# Patient Record
Sex: Female | Born: 2000 | Race: White | Hispanic: No | Marital: Single | Smoking: Former smoker
Health system: Southern US, Community
[De-identification: ages and names within clinical notes are randomized; demographics above are authoritative.]

## PROBLEM LIST (undated history)

## (undated) DIAGNOSIS — F32A Depression, unspecified: Secondary | ICD-10-CM

## (undated) DIAGNOSIS — F329 Major depressive disorder, single episode, unspecified: Secondary | ICD-10-CM

## (undated) HISTORY — PX: TONSILLECTOMY AND ADENOIDECTOMY: SUR1326

---
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
## 2018-03-11 DIAGNOSIS — M2550 Pain in unspecified joint: Secondary | ICD-10-CM | POA: Insufficient documentation

## 2018-03-11 DIAGNOSIS — R768 Other specified abnormal immunological findings in serum: Secondary | ICD-10-CM | POA: Insufficient documentation

## 2019-02-08 ENCOUNTER — Telehealth: Payer: Self-pay | Admitting: Obstetrics & Gynecology

## 2019-02-08 NOTE — Telephone Encounter (Signed)
[facility] referring for inability to achieve pregnancy. I called and spoke with patient about infertility consultation advising insurance doesn't cover appointment that it would be a self pay of 117.00 at the time of visit. Patient requested to cancel referral.

## 2019-04-04 ENCOUNTER — Other Ambulatory Visit: Payer: Self-pay | Admitting: Nurse Practitioner

## 2019-04-04 DIAGNOSIS — R102 Pelvic and perineal pain: Secondary | ICD-10-CM

## 2019-04-04 DIAGNOSIS — N949 Unspecified condition associated with female genital organs and menstrual cycle: Secondary | ICD-10-CM

## 2019-04-27 ENCOUNTER — Other Ambulatory Visit: Payer: Self-pay

## 2019-04-27 ENCOUNTER — Ambulatory Visit
Admission: RE | Admit: 2019-04-27 | Discharge: 2019-04-27 | Disposition: A | Discharge status: Home / Self Care | Payer: Medicaid Other | Source: Ambulatory Visit | Attending: Nurse Practitioner | Admitting: Nurse Practitioner

## 2019-04-27 DIAGNOSIS — N949 Unspecified condition associated with female genital organs and menstrual cycle: Secondary | ICD-10-CM | POA: Diagnosis present

## 2019-04-27 DIAGNOSIS — R102 Pelvic and perineal pain: Secondary | ICD-10-CM | POA: Diagnosis not present

## 2019-05-04 ENCOUNTER — Other Ambulatory Visit: Payer: Self-pay

## 2019-05-17 ENCOUNTER — Ambulatory Visit (INDEPENDENT_AMBULATORY_CARE_PROVIDER_SITE_OTHER): Payer: Medicaid Other | Admitting: Obstetrics and Gynecology

## 2019-05-17 ENCOUNTER — Other Ambulatory Visit: Payer: Self-pay

## 2019-05-17 ENCOUNTER — Encounter: Payer: Self-pay | Admitting: Obstetrics and Gynecology

## 2019-05-17 VITALS — BP 99/64 | HR 74 | Ht 64.0 in | Wt 121.0 lb

## 2019-05-17 DIAGNOSIS — Q524 Other congenital malformations of vagina: Secondary | ICD-10-CM

## 2019-05-17 NOTE — Progress Notes (Signed)
HPI:      Susan Price is a 19 y.o. No obstetric history on file. who LMP was Patient's last menstrual period was 04/29/2019.  Subjective:   She presents today because she recently noticed a small cyst located in her right labia.  She states that she thinks it has gotten a little bit bigger and is slightly more tender than when she first noticed it.  She mainly notices it with intercourse.  It is not generally painful on a day-to-day basis. She recently had an ultrasound which showed a 2 cm cyst.    Hx: The following portions of the patient's history were reviewed and updated as appropriate:             She  has a past medical history of Depression. She does not have a problem list on file. She  has a past surgical history that includes Tonsillectomy and adenoidectomy. Her family history includes Anxiety disorder in her mother. She  reports that she has been smoking cigarettes. She has never used smokeless tobacco. She reports that she does not drink alcohol or use drugs. She currently has no medications in their medication list. She has No Known Allergies.       Review of Systems:  Review of Systems  Constitutional: Denied constitutional symptoms, night sweats, recent illness, fatigue, fever, insomnia and weight loss.  Eyes: Denied eye symptoms, eye pain, photophobia, vision change and visual disturbance.  Ears/Nose/Throat/Neck: Denied ear, nose, throat or neck symptoms, hearing loss, nasal discharge, sinus congestion and sore throat.  Cardiovascular: Denied cardiovascular symptoms, arrhythmia, chest pain/pressure, edema, exercise intolerance, orthopnea and palpitations.  Respiratory: Denied pulmonary symptoms, asthma, pleuritic pain, productive sputum, cough, dyspnea and wheezing.  Gastrointestinal: Denied, gastro-esophageal reflux, melena, nausea and vomiting.  Genitourinary: See HPI for additional information.  Musculoskeletal: Denied musculoskeletal symptoms, stiffness,  swelling, muscle weakness and myalgia.  Dermatologic: Denied dermatology symptoms, rash and scar.  Neurologic: Denied neurology symptoms, dizziness, headache, neck pain and syncope.  Psychiatric: Denied psychiatric symptoms, anxiety and depression.  Endocrine: Denied endocrine symptoms including hot flashes and night sweats.   Meds:   No current outpatient medications on file prior to visit.   No current facility-administered medications on file prior to visit.    Objective:     Vitals:   05/17/19 1535  BP: 99/64  Pulse: 74              Examination reveals a cyst deep within the right labia majus.  Possibly 2 cm in diameter.  Nontender.  Mobile.  By exclusion this is not located in the area of sebaceous cysts or Bartholin gland cyst.  Assessment:    No obstetric history on file. There are no problems to display for this patient.    1. Embryonic cyst of right Gartner's duct     Most likely a Gartner's duct remnant cyst.  Not a lymph node, not Bartholin cyst.   Plan:            1.  I have explained the options to her.  These include expectant management, or excision.  I have strongly advised that she employ expectant management until either it enlarges or it becomes significantly uncomfortable.  At that time we can readdress the situation and consider excision.  Orders No orders of the defined types were placed in this encounter.   No orders of the defined types were placed in this encounter.     F/U  Return for Pt to contact us  if symptoms worsen. I spent 31 minutes involved in the care of this patient preparing to see the patient by obtaining and reviewing her medical history (including labs, imaging tests and prior procedures), documenting clinical information in the electronic health record (EHR), counseling and coordinating care plans, writing and sending prescriptions, ordering tests or procedures and directly communicating with the patient by discussing pertinent  items from her history and physical exam as well as detailing my assessment and plan as noted above so that she has an informed understanding.  All of her questions were answered.  Elonda Husky, M.D. 05/17/2019 4:05 PM

## 2019-08-23 ENCOUNTER — Encounter: Payer: Self-pay | Admitting: Obstetrics and Gynecology

## 2019-08-23 ENCOUNTER — Other Ambulatory Visit: Payer: Self-pay

## 2019-08-23 ENCOUNTER — Ambulatory Visit (INDEPENDENT_AMBULATORY_CARE_PROVIDER_SITE_OTHER): Payer: Medicaid Other | Admitting: Obstetrics and Gynecology

## 2019-08-23 VITALS — BP 113/75 | HR 75 | Ht 64.0 in | Wt 122.0 lb

## 2019-08-23 DIAGNOSIS — N912 Amenorrhea, unspecified: Secondary | ICD-10-CM | POA: Diagnosis not present

## 2019-08-23 LAB — POCT URINE PREGNANCY: Preg Test, Ur: POSITIVE — AB

## 2019-08-23 NOTE — Progress Notes (Signed)
HPI:      Susan Price is a 19 y.o. No obstetric history on file. who LMP was No LMP recorded.  Subjective:   She presents today for pregnancy confirmation. She was attempting pregnancy and had been attempting for "approximately 2 years". Patient is taking vitamins but not prenatal vitamins. She has occasional nausea without vomiting. Patient states that she was a regular marijuana user but she stopped this and she noted she was pregnant.    Hx: The following portions of the patient's history were reviewed and updated as appropriate:             She  has a past medical history of Depression. She does not have a problem list on file. She  has a past surgical history that includes Tonsillectomy and adenoidectomy. Her family history includes Anxiety disorder in her mother. She  reports that she has quit smoking. Her smoking use included cigarettes. She has never used smokeless tobacco. She reports current drug use. Drug: Marijuana. She reports that she does not drink alcohol. She has a current medication list which includes the following prescription(s): multivitamin-prenatal. She has No Known Allergies.       Review of Systems:  Review of Systems  Constitutional: Denied constitutional symptoms, night sweats, recent illness, fatigue, fever, insomnia and weight loss.  Eyes: Denied eye symptoms, eye pain, photophobia, vision change and visual disturbance.  Ears/Nose/Throat/Neck: Denied ear, nose, throat or neck symptoms, hearing loss, nasal discharge, sinus congestion and sore throat.  Cardiovascular: Denied cardiovascular symptoms, arrhythmia, chest pain/pressure, edema, exercise intolerance, orthopnea and palpitations.  Respiratory: Denied pulmonary symptoms, asthma, pleuritic pain, productive sputum, cough, dyspnea and wheezing.  Gastrointestinal: Denied, gastro-esophageal reflux, melena, nausea and vomiting.  Genitourinary: Denied genitourinary symptoms including symptomatic vaginal  discharge, pelvic relaxation issues, and urinary complaints.  Musculoskeletal: Denied musculoskeletal symptoms, stiffness, swelling, muscle weakness and myalgia.  Dermatologic: Denied dermatology symptoms, rash and scar.  Neurologic: Denied neurology symptoms, dizziness, headache, neck pain and syncope.  Psychiatric: Denied psychiatric symptoms, anxiety and depression.  Endocrine: Denied endocrine symptoms including hot flashes and night sweats.   Meds:   Current Outpatient Medications on File Prior to Visit  Medication Sig Dispense Refill  . Prenatal Vit-Fe Fumarate-FA (MULTIVITAMIN-PRENATAL) 27-0.8 MG TABS tablet Take 1 tablet by mouth daily at 12 noon.     No current facility-administered medications on file prior to visit.    Objective:     Vitals:   08/23/19 0810  BP: 113/75  Pulse: 75              Urinary pregnancy test positive  Assessment:    No obstetric history on file. There are no problems to display for this patient.    1. Amenorrhea     Approximately 4 weeks estimated gestational age based on last menstrual period.   Plan:            1.  Prenatal Plan 1.  The patient was given prenatal literature. 2.  She was begun on prenatal vitamins. 3.  A prenatal lab panel was ordered or drawn. 4.  An ultrasound was ordered to better determine an EDC. 5.  A nurse visit was scheduled. 6.  Genetic testing and testing for other inheritable conditions discussed in detail. She will decide in the future whether to have these labs performed. 7.  A general overview of pregnancy testing, visit schedule, ultrasound schedule, and prenatal care was discussed. 8.  COVID and its risks associated with pregnancy, prevention by limiting  exposure and use of masks, as well as the risks and benefits of vaccination during pregnancy were discussed in detail.  [facility] policy regarding office and hospital visitation and testing was explained. 9.  Benefits of breast-feeding discussed in  detail including both maternal and infant benefits. Ready Set Baby website discussed.   Orders Orders Placed This Encounter  Procedures  . US OB Comp Less 14 Wks  . US OB Transvaginal  . POCT urine pregnancy    No orders of the defined types were placed in this encounter.     F/U  Return in about 4 weeks (around 09/20/2019). I spent 33 minutes involved in the care of this patient preparing to see the patient by obtaining and reviewing her medical history (including labs, imaging tests and prior procedures), documenting clinical information in the electronic health record (EHR), counseling and coordinating care plans, writing and sending prescriptions, ordering tests or procedures and directly communicating with the patient by discussing pertinent items from her history and physical exam as well as detailing my assessment and plan as noted above so that she has an informed understanding.  All of her questions were answered.  Finis Bud, M.D. 08/23/2019 8:49 AM

## 2019-08-28 ENCOUNTER — Encounter: Payer: Self-pay | Admitting: Emergency Medicine

## 2019-08-28 ENCOUNTER — Emergency Department: Payer: Medicaid Other

## 2019-08-28 ENCOUNTER — Other Ambulatory Visit: Payer: Self-pay

## 2019-08-28 DIAGNOSIS — Z3A01 Less than 8 weeks gestation of pregnancy: Secondary | ICD-10-CM | POA: Diagnosis not present

## 2019-08-28 DIAGNOSIS — O209 Hemorrhage in early pregnancy, unspecified: Secondary | ICD-10-CM | POA: Diagnosis not present

## 2019-08-28 DIAGNOSIS — Z5321 Procedure and treatment not carried out due to patient leaving prior to being seen by health care provider: Secondary | ICD-10-CM | POA: Diagnosis not present

## 2019-08-28 LAB — CBC WITH DIFFERENTIAL/PLATELET
Abs Immature Granulocytes: 0.02 10*3/uL (ref 0.00–0.07)
Basophils Absolute: 0 10*3/uL (ref 0.0–0.1)
Basophils Relative: 0 %
Eosinophils Absolute: 0.1 10*3/uL (ref 0.0–0.5)
Eosinophils Relative: 1 %
HCT: 39.8 % (ref 36.0–46.0)
Hemoglobin: 13.9 g/dL (ref 12.0–15.0)
Immature Granulocytes: 0 %
Lymphocytes Relative: 39 %
Lymphs Abs: 2.7 10*3/uL (ref 0.7–4.0)
MCH: 28.5 pg (ref 26.0–34.0)
MCHC: 34.9 g/dL (ref 30.0–36.0)
MCV: 81.7 fL (ref 80.0–100.0)
Monocytes Absolute: 0.6 10*3/uL (ref 0.1–1.0)
Monocytes Relative: 9 %
Neutro Abs: 3.4 10*3/uL (ref 1.7–7.7)
Neutrophils Relative %: 51 %
Platelets: 241 10*3/uL (ref 150–400)
RBC: 4.87 MIL/uL (ref 3.87–5.11)
RDW: 12.8 % (ref 11.5–15.5)
WBC: 6.8 10*3/uL (ref 4.0–10.5)
nRBC: 0 % (ref 0.0–0.2)

## 2019-08-28 LAB — BASIC METABOLIC PANEL
Anion gap: 9 (ref 5–15)
BUN: 10 mg/dL (ref 6–20)
CO2: 22 mmol/L (ref 22–32)
Calcium: 9.4 mg/dL (ref 8.9–10.3)
Chloride: 105 mmol/L (ref 98–111)
Creatinine, Ser: 0.49 mg/dL (ref 0.44–1.00)
GFR calc Af Amer: >60 mL/min (ref >60)
GFR calc non Af Amer: >60 mL/min (ref >60)
Glucose, Bld: 95 mg/dL (ref 70–99)
Potassium: 3.6 mmol/L (ref 3.5–5.1)
Sodium: 136 mmol/L (ref 135–145)

## 2019-08-28 LAB — TYPE AND SCREEN
ABO/RH(D): O NEG
Antibody Screen: NEGATIVE

## 2019-08-28 NOTE — ED Triage Notes (Signed)
Pt arrives ambulatory to triage with c/o vaginal bleeding. Pt reports being approximately [redacted] weeks pregnant at this time. Pt states that she had some spotting this morning but now is having bright red blood at this time but not using more than one pad an hour. Pt is tearful in triage but is otherwise in NAD.

## 2019-08-29 ENCOUNTER — Emergency Department
Admission: EM | Admit: 2019-08-29 | Discharge: 2019-08-29 | Disposition: A | Discharge status: Home / Self Care | Payer: Medicaid Other | Attending: Emergency Medicine | Admitting: Emergency Medicine

## 2019-08-29 ENCOUNTER — Ambulatory Visit (INDEPENDENT_AMBULATORY_CARE_PROVIDER_SITE_OTHER): Payer: Medicaid Other | Admitting: Obstetrics and Gynecology

## 2019-08-29 ENCOUNTER — Encounter: Payer: Self-pay | Admitting: Obstetrics and Gynecology

## 2019-08-29 ENCOUNTER — Telehealth: Payer: Self-pay | Admitting: Emergency Medicine

## 2019-08-29 VITALS — BP 98/62 | Wt 119.0 lb

## 2019-08-29 DIAGNOSIS — O3680X0 Pregnancy with inconclusive fetal viability, not applicable or unspecified: Secondary | ICD-10-CM | POA: Diagnosis not present

## 2019-08-29 DIAGNOSIS — Z3400 Encounter for supervision of normal first pregnancy, unspecified trimester: Secondary | ICD-10-CM | POA: Diagnosis not present

## 2019-08-29 DIAGNOSIS — O209 Hemorrhage in early pregnancy, unspecified: Secondary | ICD-10-CM

## 2019-08-29 DIAGNOSIS — B373 Candidiasis of vulva and vagina: Secondary | ICD-10-CM

## 2019-08-29 DIAGNOSIS — Z3A01 Less than 8 weeks gestation of pregnancy: Secondary | ICD-10-CM | POA: Diagnosis not present

## 2019-08-29 DIAGNOSIS — N760 Acute vaginitis: Secondary | ICD-10-CM

## 2019-08-29 DIAGNOSIS — B3731 Acute candidiasis of vulva and vagina: Secondary | ICD-10-CM

## 2019-08-29 LAB — HCG, QUANTITATIVE, PREGNANCY: hCG, Beta Chain, Quant, S: 19062 m[IU]/mL — ABNORMAL HIGH (ref <5)

## 2019-08-29 MED ORDER — RHO D IMMUNE GLOBULIN 1500 UNIT/2ML IJ SOSY
300.0000 ug | PREFILLED_SYRINGE | Freq: Once | INTRAMUSCULAR | Status: AC
  Administered 2019-08-29: 300 ug via INTRAMUSCULAR

## 2019-08-29 MED ORDER — TERCONAZOLE 0.4 % VA CREA: 1.0000 | TOPICAL_CREAM | Freq: Every day | VAGINAL | 0 refills | Status: DC

## 2019-08-29 NOTE — Telephone Encounter (Signed)
Called patient due to lwot to inquire about condition and follow up plans.   She says she has stopped bleeding at this time.  I explained negative rh and importance of being seen.  She says she has appointment at [facility] today.   I also called [facility] and informed them of RH neg result--they are able to give rhogam there.

## 2019-08-29 NOTE — Progress Notes (Signed)
08/29/2019   Chief Complaint: Missed period  Transfer of Care Patient: yes  History of Present Illness: Susan Price is a 19 y.o. G1P0 [redacted]w[redacted]d based on Patient's last menstrual period was 07/24/2019 (exact date). with an Estimated Date of Delivery: 04/29/20, with the above CC.   Her periods were: regular monthly periods She was using no method when she conceived.  She has Negative signs or symptoms of nausea/vomiting of pregnancy. She has Positive signs or symptoms of miscarriage or preterm labor She was not taking different medications around the time she conceived/early pregnancy. Since her LMP, she has not used alcohol Since her LMP, she has not used tobacco products Since her LMP, she has not used illegal drugs.    Current or past history of domestic violence. no  Infection History:  1. Since her LMP, she has not had a viral illness.  2. She denies close contact with children on a regular basis     3. She denies a history of chicken pox. She denies vaccination for chicken pox in the past. 4. Patient or partner has history of genital herpes  no 5. History of STI (GC, CT, HPV, syphilis, HIV)  no   6.  She does not live with someone with TB or TB exposed. 7. History of recent travel :  no 8. She identifies Negative Zika risk factors for her and her partner 4. There are not cats in the home in the home.  She understands that while pregnant she should not change cat litter.   Genetic Screening Questions: (Includes patient, baby's father, or anyone in either family)   1. Patient's age >/= 46 at [facility]  no 2. Thalassemia ([place], [place], Mediterranean, or Asian background): MCV<80  no 3. Neural tube defect (meningomyelocele, spina bifida, anencephaly)  no 4. Congenital heart defect  no  5. Down syndrome  no 6. Tay-Sachs (Jewish, [place]  no 7. Canavan's Disease  no 8. Sickle cell disease or trait (African)  no  9. Hemophilia or other blood disorders  no  10. Muscular dystrophy   no  11. Cystic fibrosis  no  12. Huntington's Chorea  no  13. Mental retardation/autism  no 14. Other inherited genetic or chromosomal disorder  no 15. Maternal metabolic disorder (DM, PKU, etc)  no 16. Patient or FOB with a child with a birth defect not listed above no  16a. Patient or FOB with a birth defect themselves no 17. Recurrent pregnancy loss, or stillbirth  no  18. Any medications since LMP other than prenatal vitamins (include vitamins, supplements, OTC meds, drugs, alcohol)  no 19. Any other genetic/environmental exposure to discuss  no  ROS:  Review of Systems  Constitutional: Negative for chills, fever, malaise/fatigue and weight loss.  HENT: Negative for congestion, hearing loss and sinus pain.   Eyes: Negative for blurred vision and double vision.  Respiratory: Negative for cough, sputum production, shortness of breath and wheezing.   Cardiovascular: Negative for chest pain, palpitations, orthopnea and leg swelling.  Gastrointestinal: Negative for abdominal pain, constipation, diarrhea, nausea and vomiting.  Genitourinary: Negative for dysuria, flank pain, frequency, hematuria and urgency.  Musculoskeletal: Negative for back pain, falls and joint pain.  Skin: Negative for itching and rash.  Neurological: Negative for dizziness and headaches.  Psychiatric/Behavioral: Negative for depression, substance abuse and suicidal ideas. The patient is not nervous/anxious.     OBGYN History: As per HPI. OB History  Gravida Para Term Preterm AB Living  1  SAB TAB Ectopic Multiple Live Births               # Outcome Date GA Lbr Len/2nd Weight Sex Delivery Anes PTL Lv  1 Current             Any issues with any prior pregnancies: not applicable Any prior children are healthy, doing well, without any problems or issues: not applicable Last pap smear under 19 History of STIs: No   Past Medical History: Past Medical History:  Diagnosis Date  . Depression      Past Surgical History: Past Surgical History:  Procedure Laterality Date  . TONSILLECTOMY AND ADENOIDECTOMY      Family History:  Family History  Problem Relation Age of Onset  . Anxiety disorder Mother    She denies any female cancers, bleeding or blood clotting disorders.   Social History:  Social History   Socioeconomic History  . Marital status: Single    Spouse name: Not on file  . Number of children: Not on file  . Years of education: Not on file  . Highest education level: Not on file  Occupational History  . Not on file  Tobacco Use  . Smoking status: Former Smoker    Types: Cigarettes  . Smokeless tobacco: Never Used  Substance and Sexual Activity  . Alcohol use: No  . Drug use: Yes    Types: Marijuana  . Sexual activity: Yes    Partners: Male    Birth control/protection: None  Other Topics Concern  . Not on file  Social History Narrative  . Not on file   Social Determinants of Health   Financial Resource Strain:   . Difficulty of Paying Living Expenses:   Food Insecurity:   . Worried About Programme researcher, broadcasting/film/video in the Last Year:   . Barista in the Last Year:   Transportation Needs:   . Freight forwarder (Medical):   Marland Kitchen Lack of Transportation (Non-Medical):   Physical Activity:   . Days of Exercise per Week:   . Minutes of Exercise per Session:   Stress:   . Feeling of Stress :   Social Connections:   . Frequency of Communication with Friends and Family:   . Frequency of Social Gatherings with Friends and Family:   . Attends Religious Services:   . Active Member of Clubs or Organizations:   . Attends Banker Meetings:   Marland Kitchen Marital Status:   Intimate Partner Violence:   . Fear of Current or Ex-Partner:   . Emotionally Abused:   Marland Kitchen Physically Abused:   . Sexually Abused:     Allergy: No Known Allergies  Current Outpatient Medications:  Current Outpatient Medications:  .  Prenatal Vit-Fe Fumarate-FA  (MULTIVITAMIN-PRENATAL) 27-0.8 MG TABS tablet, Take 1 tablet by mouth daily at 12 noon., Disp: , Rfl:  .  terconazole (TERAZOL 7) 0.4 % vaginal cream, Place 1 applicator vaginally at bedtime., Disp: 45 g, Rfl: 0   Physical Exam: Physical Exam  Constitutional: She is oriented to person, place, and time and well-developed, well-nourished, and in no distress.  HENT:  Head: Normocephalic and atraumatic.  Eyes: Pupils are equal, round, and reactive to light.  Neck: No thyromegaly present.  Cardiovascular: Normal rate and regular rhythm.  Pulmonary/Chest: Effort normal.  Abdominal: Soft. Bowel sounds are normal. She exhibits no distension. There is no abdominal tenderness. There is no rebound and no guarding.  Genitourinary:    Genitourinary Comments: External:  Normal appearing vulva. No lesions noted.  Speculum examination: Normal appearing cervix. No blood in the vaginal vault. Clumpy white vagina discharge.  Bimanual examination: Uterus midline, non-tender, normal in size, shape and contour.  No CMT. No adnexal masses. No adnexal tenderness. Pelvis not fixed.    Musculoskeletal:        General: Normal range of motion.     Cervical back: Normal range of motion and neck supple.  Neurological: She is alert and oriented to person, place, and time.  Skin: Skin is warm and dry.  Psychiatric: Affect and judgment normal.  Nursing note and vitals reviewed.  Wet Prep: PH: normal Clue Cells: Negative Fungal elements: Positive Trichomonas: Negative  Assessment: Ms. Gregg is a 19 y.o. G1P0 [redacted]w[redacted]d based on Patient's last menstrual period was 07/24/2019 (exact date). with an Estimated Date of Delivery: 04/29/20,  for prenatal care.  Plan:  1) Avoid alcoholic beverages. 2) Patient encouraged not to smoke.  3) Discontinue the use of all non-medicinal drugs and chemicals.  4) Take prenatal vitamins daily.  5) Seatbelt use advised 6) Nutrition, food safety (fish, cheese advisories, and high nitrite  foods) and exercise discussed. 7) Hospital and practice style delivering at [facility] discussed  8) Patient is asked about travel to areas at risk for the [place] virus, and counseled to avoid travel and exposure to mosquitoes or sexual partners who may have themselves been exposed to the virus. Testing is discussed, and will be ordered as appropriate.  9) Childbirth classes at [facility] advised 10) Genetic Screening, such as with 1st Trimester Screening, cell free fetal DNA, AFP testing, and Ultrasound, as well as with amniocentesis and CVS as appropriate, is discussed with patient. She plans to have genetic testing this pregnancy.   Will treat for yeast infection. Rhogam today for bleeding in early pregnancy.  Follow up in 2 week for viability [place]  Problem list reviewed and updated.  I discussed the assessment and treatment plan with the patient. The patient was provided an opportunity to ask questions and all were answered. The patient agreed with the plan and demonstrated an understanding of the instructions.  Adrian Prows MD [facility], [facility] 08/29/2019 4:36 PM

## 2019-08-29 NOTE — Patient Instructions (Signed)
First Trimester of Pregnancy The first trimester of pregnancy is from week 1 until the end of week 13 (months 1 through 3). A week after a sperm fertilizes an egg, the egg will implant on the wall of the uterus. This embryo will begin to develop into a baby. Genes from you and your partner will form the baby. The female genes will determine whether the baby will be a boy or a girl. At 6-8 weeks, the eyes and face will be formed, and the heartbeat can be seen on ultrasound. At the end of 12 weeks, all the baby's organs will be formed. Now that you are pregnant, you will want to do everything you can to have a healthy baby. Two of the most important things are to get good prenatal care and to follow your health care provider's instructions. Prenatal care is all the medical care you receive before the baby's birth. This care will help prevent, find, and treat any problems during the pregnancy and childbirth. Body changes during your first trimester Your body goes through many changes during pregnancy. The changes vary from woman to woman.  You may gain or lose a couple of pounds at first.  You may feel sick to your stomach (nauseous) and you may throw up (vomit). If the vomiting is uncontrollable, call your health care provider.  You may tire easily.  You may develop headaches that can be relieved by medicines. All medicines should be approved by your health care provider.  You may urinate more often. Painful urination may mean you have a bladder infection.  You may develop heartburn as a result of your pregnancy.  You may develop constipation because certain hormones are causing the muscles that push stool through your intestines to slow down.  You may develop hemorrhoids or swollen veins (varicose veins).  Your breasts may begin to grow larger and become tender. Your nipples may stick out more, and the tissue that surrounds them (areola) may become darker.  Your gums may bleed and may be  sensitive to brushing and flossing.  Dark spots or blotches (chloasma, mask of pregnancy) may develop on your face. This will likely fade after the baby is born.  Your menstrual periods will stop.  You may have a loss of appetite.  You may develop cravings for certain kinds of food.  You may have changes in your emotions from day to day, such as being excited to be pregnant or being concerned that something may go wrong with the pregnancy and baby.  You may have more vivid and strange dreams.  You may have changes in your hair. These can include thickening of your hair, rapid growth, and changes in texture. Some women also have hair loss during or after pregnancy, or hair that feels dry or thin. Your hair will most likely return to normal after your baby is born. What to expect at prenatal visits During a routine prenatal visit:  You will be weighed to make sure you and the baby are growing normally.  Your blood pressure will be taken.  Your abdomen will be measured to track your baby's growth.  The fetal heartbeat will be listened to between weeks 10 and 14 of your pregnancy.  Test results from any previous visits will be discussed. Your health care provider may ask you:  How you are feeling.  If you are feeling the baby move.  If you have had any abnormal symptoms, such as leaking fluid, bleeding, severe headaches, or abdominal   cramping.  If you are using any tobacco products, including cigarettes, chewing tobacco, and electronic cigarettes.  If you have any questions. Other tests that may be performed during your first trimester include:  Blood tests to find your blood type and to check for the presence of any previous infections. The tests will also be used to check for low iron levels (anemia) and protein on red blood cells (Rh antibodies). Depending on your risk factors, or if you previously had diabetes during pregnancy, you may have tests to check for high blood sugar  that affects pregnant women (gestational diabetes).  Urine tests to check for infections, diabetes, or protein in the urine.  An ultrasound to confirm the proper growth and development of the baby.  Fetal screens for spinal cord problems (spina bifida) and Down syndrome.  HIV (human immunodeficiency virus) testing. Routine prenatal testing includes screening for HIV, unless you choose not to have this test.  You may need other tests to make sure you and the baby are doing well. Follow these instructions at home: Medicines  Follow your health care provider's instructions regarding medicine use. Specific medicines may be either safe or unsafe to take during pregnancy.  Take a prenatal vitamin that contains at least 600 micrograms (mcg) of folic acid.  If you develop constipation, try taking a stool softener if your health care provider approves. Eating and drinking   Eat a balanced diet that includes fresh fruits and vegetables, whole grains, good sources of protein such as meat, eggs, or tofu, and low-fat dairy. Your health care provider will help you determine the amount of weight gain that is right for you.  Avoid raw meat and uncooked cheese. These carry germs that can cause birth defects in the baby.  Eating four or five small meals rather than three large meals a day may help relieve nausea and vomiting. If you start to feel nauseous, eating a few soda crackers can be helpful. Drinking liquids between meals, instead of during meals, also seems to help ease nausea and vomiting.  Limit foods that are high in fat and processed sugars, such as fried and sweet foods.  To prevent constipation: ? Eat foods that are high in fiber, such as fresh fruits and vegetables, whole grains, and beans. ? Drink enough fluid to keep your urine clear or pale yellow. Activity  Exercise only as directed by your health care provider. Most women can continue their usual exercise routine during  pregnancy. Try to exercise for 30 minutes at least 5 days a week. Exercising will help you: ? Control your weight. ? Stay in shape. ? Be prepared for labor and delivery.  Experiencing pain or cramping in the lower abdomen or lower back is a good sign that you should stop exercising. Check with your health care provider before continuing with normal exercises.  Try to avoid standing for long periods of time. Move your legs often if you must stand in one place for a long time.  Avoid heavy lifting.  Wear low-heeled shoes and practice good posture.  You may continue to have sex unless your health care provider tells you not to. Relieving pain and discomfort  Wear a good support bra to relieve breast tenderness.  Take warm sitz baths to soothe any pain or discomfort caused by hemorrhoids. Use hemorrhoid cream if your health care provider approves.  Rest with your legs elevated if you have leg cramps or low back pain.  If you develop varicose veins in   your legs, wear support hose. Elevate your feet for 15 minutes, 3-4 times a day. Limit salt in your diet. Prenatal care  Schedule your prenatal visits by the twelfth week of pregnancy. They are usually scheduled monthly at first, then more often in the last 2 months before delivery.  Write down your questions. Take them to your prenatal visits.  Keep all your prenatal visits as told by your health care provider. This is important. Safety  Wear your seat belt at all times when driving.  Make a list of emergency phone numbers, including numbers for family, friends, the hospital, and police and fire departments. General instructions  Ask your health care provider for a referral to a local prenatal education class. Begin classes no later than the beginning of month 6 of your pregnancy.  Ask for help if you have counseling or nutritional needs during pregnancy. Your health care provider can offer advice or refer you to specialists for help  with various needs.  Do not use hot tubs, steam rooms, or saunas.  Do not douche or use tampons or scented sanitary pads.  Do not cross your legs for long periods of time.  Avoid cat litter boxes and soil used by cats. These carry germs that can cause birth defects in the baby and possibly loss of the fetus by miscarriage or stillbirth.  Avoid all smoking, herbs, alcohol, and medicines not prescribed by your health care provider. Chemicals in these products affect the formation and growth of the baby.  Do not use any products that contain nicotine or tobacco, such as cigarettes and e-cigarettes. If you need help quitting, ask your health care provider. You may receive counseling support and other resources to help you quit.  Schedule a dentist appointment. At home, brush your teeth with a soft toothbrush and be gentle when you floss. Contact a health care provider if:  You have dizziness.  You have mild pelvic cramps, pelvic pressure, or nagging pain in the abdominal area.  You have persistent nausea, vomiting, or diarrhea.  You have a bad smelling vaginal discharge.  You have pain when you urinate.  You notice increased swelling in your face, hands, legs, or ankles.  You are exposed to fifth disease or chickenpox.  You are exposed to German measles (rubella) and have never had it. Get help right away if:  You have a fever.  You are leaking fluid from your vagina.  You have spotting or bleeding from your vagina.  You have severe abdominal cramping or pain.  You have rapid weight gain or loss.  You vomit blood or material that looks like coffee grounds.  You develop a severe headache.  You have shortness of breath.  You have any kind of trauma, such as from a fall or a car accident. Summary  The first trimester of pregnancy is from week 1 until the end of week 13 (months 1 through 3).  Your body goes through many changes during pregnancy. The changes vary from  woman to woman.  You will have routine prenatal visits. During those visits, your health care provider will examine you, discuss any test results you may have, and talk with you about how you are feeling. This information is not intended to replace advice given to you by your health care provider. Make sure you discuss any questions you have with your health care provider. Document Revised: 03/06/2017 Document Reviewed: 03/05/2016 Elsevier Patient Education  2020 Elsevier Inc.  

## 2019-08-31 ENCOUNTER — Emergency Department
Admission: EM | Admit: 2019-08-31 | Discharge: 2019-08-31 | Disposition: A | Discharge status: Home / Self Care | Payer: Medicaid Other | Attending: Emergency Medicine | Admitting: Emergency Medicine

## 2019-08-31 ENCOUNTER — Other Ambulatory Visit: Payer: Self-pay

## 2019-08-31 DIAGNOSIS — Z5321 Procedure and treatment not carried out due to patient leaving prior to being seen by health care provider: Secondary | ICD-10-CM | POA: Diagnosis not present

## 2019-08-31 DIAGNOSIS — Z3A01 Less than 8 weeks gestation of pregnancy: Secondary | ICD-10-CM | POA: Insufficient documentation

## 2019-08-31 DIAGNOSIS — O209 Hemorrhage in early pregnancy, unspecified: Secondary | ICD-10-CM | POA: Diagnosis not present

## 2019-08-31 DIAGNOSIS — R112 Nausea with vomiting, unspecified: Secondary | ICD-10-CM | POA: Diagnosis not present

## 2019-08-31 LAB — POC URINE PREG, ED: Preg Test, Ur: POSITIVE — AB

## 2019-08-31 LAB — HCG, QUANTITATIVE, PREGNANCY: hCG, Beta Chain, Quant, S: 34931 m[IU]/mL — ABNORMAL HIGH (ref <5)

## 2019-08-31 NOTE — Telephone Encounter (Signed)
I wrote back to this patient. Please speak to me directly or send me a secure chat when something comes up that I have to addrss same day. I did not see this until 10 PM.

## 2019-08-31 NOTE — Telephone Encounter (Signed)
Epic shows pt is at ED now. Spoke w/patient as no DPR on file. Advised ED is where she needs to be. She states there are 25 people waiting & she likely won't be seen for some time. Advised will pass message to Dr. Jerene Pitch.

## 2019-08-31 NOTE — Telephone Encounter (Signed)
In case she calls

## 2019-08-31 NOTE — ED Triage Notes (Signed)
Pt comes via POV from home with c/o vaginal bleeding. Pt states N/V. Pt states she is [redacted] weeks pregnant and has had some vaginal bleeding for 2 days. Pt was here the other night but left because the wait was long.  Pt denies any abdominal pain.

## 2019-08-31 NOTE — ED Notes (Signed)
Pt to desk to report that she is now bleeding blood clots. Pt noted to be tearful and states "oh my god I'm like freaking out". This RN reviewed orders and explained that orders for [place] had been placed. Pt continues to be tearful at desk at this time.

## 2019-08-31 NOTE — ED Notes (Signed)
Pt to desk with friend, pt continues to be visibly upset, pt noted to be on phone at this time with family member stating "I don't know what to do, they won't see me!" Pt's friend states, "come on we are going to [place]". Pt visualized leaving the ED at this time with steady gait.

## 2019-08-31 NOTE — Telephone Encounter (Signed)
Misty  (mom) reports pt is pregnant and woke up this morning with blood all in her shorts. Requesting return call.

## 2019-09-01 ENCOUNTER — Ambulatory Visit (INDEPENDENT_AMBULATORY_CARE_PROVIDER_SITE_OTHER): Payer: Medicaid Other | Admitting: Obstetrics and Gynecology

## 2019-09-01 VITALS — BP 109/74 | Wt 116.0 lb

## 2019-09-01 DIAGNOSIS — O209 Hemorrhage in early pregnancy, unspecified: Secondary | ICD-10-CM | POA: Diagnosis not present

## 2019-09-01 LAB — URINE CULTURE: Organism ID, Bacteria: NO GROWTH

## 2019-09-01 MED ORDER — LIDOCAINE HCL 1 % IJ SOLN
0.50 | INTRAMUSCULAR | Status: DC
Start: ? — End: 2019-09-01

## 2019-09-01 NOTE — Progress Notes (Signed)
Obstetric Problem Visit    Chief Complaint:  Chief Complaint  Patient presents with  . Susan Price    History of Present Illness: Patient is a 19 y.o. G1P0 [redacted]w[redacted]d presenting for first trimester bleeding.  The onset of bleeding was over a week ago.  Is bleeding equal to or greater than normal menstrual flow:  No Any recent trauma:  No Recent intercourse:  No History of prior miscarriage:  No Prior ultrasound demonstrating IUP: ultrasound showing early GS and yolk sac 08/29/2019.  Prior ultrasound demonstrating viable IUP:  No Prior Serum HCG:  Yes 34,931 mIU/mL on 08/31/2019 Rh status: negative status post rhogam 08/29/2019  Review of Systems: Review of Systems  Constitutional: Negative.   Gastrointestinal: Negative.   Genitourinary: Negative.     Past Medical History:  Patient Active Problem List   Diagnosis Date Noted  . Polyarthralgia 03/11/2018  . Rheumatoid factor positive 03/11/2018    Formatting of this note might be different from the original. RF positive 02/2018 at 22 (normal 0-13.9) Negative ANA (02/2018)     Past Surgical History:  Past Surgical History:  Procedure Laterality Date  . TONSILLECTOMY AND ADENOIDECTOMY      Obstetric History: G1P0  Family History:  Family History  Problem Relation Age of Onset  . Anxiety disorder Mother     Social History:  Social History   Socioeconomic History  . Marital status: Single    Spouse name: Not on file  . Number of children: Not on file  . Years of education: Not on file  . Highest education level: Not on file  Occupational History  . Not on file  Tobacco Use  . Smoking status: Former Smoker    Types: Cigarettes  . Smokeless tobacco: Never Used  Substance and Sexual Activity  . Alcohol use: No  . Drug use: Yes    Types: Marijuana  . Sexual activity: Yes    Partners: Male    Birth control/protection: None  Other Topics Concern  . Not on file  Social History Narrative  . Not on file   Social  Determinants of Health   Financial Resource Strain:   . Difficulty of Paying Living Expenses:   Food Insecurity:   . Worried About Charity fundraiser in the Last Year:   . Arboriculturist in the Last Year:   Transportation Needs:   . Film/video editor (Medical):   Marland Kitchen Lack of Transportation (Non-Medical):   Physical Activity:   . Days of Exercise per Week:   . Minutes of Exercise per Session:   Stress:   . Feeling of Stress :   Social Connections:   . Frequency of Communication with Friends and Family:   . Frequency of Social Gatherings with Friends and Family:   . Attends Religious Services:   . Active Member of Clubs or Organizations:   . Attends Archivist Meetings:   Marland Kitchen Marital Status:   Intimate Partner Violence:   . Fear of Current or Ex-Partner:   . Emotionally Abused:   Marland Kitchen Physically Abused:   . Sexually Abused:     Allergies:  No Known Allergies  Medications: Prior to Admission medications   Medication Sig Start Date End Date Taking? Authorizing Provider  Prenatal Vit-Fe Fumarate-FA (MULTIVITAMIN-PRENATAL) 27-0.8 MG TABS tablet Take 1 tablet by mouth daily at 12 noon.   Yes [provider]  pyridOXINE (VITAMIN B-6) 25 MG tablet Take by mouth. 08/31/19 08/30/20  [provider]  terconazole (TERAZOL 7) 0.4 % vaginal cream Place 1 applicator vaginally at bedtime. Patient not taking: Reported on 09/01/2019 08/29/19   Natale Milch, MD    Physical Exam Vitals: Blood pressure 109/74, weight 116 lb (52.6 kg), last menstrual period 07/24/2019. General: NAD HEENT: normocephalic, anicteric Pulmonary: No increased work of breathing, Genitourinary:  External: Normal external female genitalia.  Normal urethral meatus, normal Bartholin's and Skene's glands.    Vagina: Normal vaginal mucosa, no evidence of prolapse.  Old brown blood noted in vault  Cervix: closed  Uterus:non-tender, non-enlarged  Adnexa: ovaries non-enlarged, no adnexal  masses  Rectal: deferred Extremities: no edema, erythema, or tenderness Neurologic: Grossly intact Psychiatric: mood appropriate, affect full  Bedside US early IUP visualized, normal shape gestational sac with early yolk sac.  No definitive fetal pole or cardiac activity yet.    Assessment: 19 y.o. G1P0 [redacted]w[redacted]d presenting for evaluation of first trimester vaginal bleeding  Plan: Problem List Items Addressed This Visit    None    Visit Diagnoses    Vaginal bleeding in pregnancy, first trimester    -  Primary   Relevant Orders   Beta hCG quant (ref lab)   Progesterone      1) First trimester bleeding - incidence and clinical course of first trimester bleeding is discussed in detail with the patient today.  Approximately 1/3 of pregnancies ending in live births experienced 1st trimester bleeding.  The amount of bleeding is variable and not necessarily predictive of outcome.  Sources may be cervical or uterine.  Subchorionic hemorrhages are a frequent concurrent findings on ultrasound and are followed expectantly.  These often absorb or regress spontaneously although risk for expansion and further disruption of the utero-placental interface leading to miscarriage is possible.  There is no clearly documented benefit to limiting or modifying activity and sexual intercourse in altering clinic course of 1st trimester bleeding.    2) If no already done will proceed with TVUS evaluation to document viability, and if uncertain viability or absence of a demonstrable IUP (and no previous documentation of IUP) will trend HCG levels.  Also progesterone level ordered today.  3) The patient is Rh negative, rhogam previously administered  4) Routine bleeding precautions were discussed with the patient prior the conclusion of today's visit.   Vena Austria, MD, [facility], [facility] 09/01/2019, 3:23 PM

## 2019-09-01 NOTE — Progress Notes (Signed)
ROB Vaginal bleeding, brown in color

## 2019-09-02 ENCOUNTER — Other Ambulatory Visit: Payer: Self-pay | Admitting: Obstetrics and Gynecology

## 2019-09-02 ENCOUNTER — Telehealth: Payer: Self-pay

## 2019-09-02 LAB — PROGESTERONE: Progesterone: 32 ng/mL

## 2019-09-02 LAB — NUSWAB VAGINITIS PLUS (VG+)
Candida albicans, NAA: NEGATIVE
Candida glabrata, NAA: NEGATIVE
Chlamydia trachomatis, NAA: NEGATIVE
Neisseria gonorrhoeae, NAA: NEGATIVE
Trich vag by NAA: NEGATIVE

## 2019-09-02 LAB — BETA HCG QUANT (REF LAB): hCG Quant: 38602 m[IU]/mL

## 2019-09-02 MED ORDER — PROMETHAZINE HCL 12.5 MG PO TABS
12.5000 mg | ORAL_TABLET | Freq: Four times a day (QID) | ORAL | 0 refills | Status: DC | PRN
Start: 2019-09-02 — End: 2019-09-21

## 2019-09-02 NOTE — Telephone Encounter (Signed)
Sent phenergan Rx

## 2019-09-02 NOTE — Telephone Encounter (Signed)
[name] (mom) reports patient is very sick, can't keep anything down, throwing up everything & feels dehydrated. Unsure what to do. [phone]

## 2019-09-02 NOTE — Telephone Encounter (Signed)
Patient aware. Will send generic note to my chart.

## 2019-09-02 NOTE — Telephone Encounter (Signed)
Spoke w/patient. She reports the last thing she was able to eat and keep down for more than 30 minutes was an orange last night at 10 pm. She vomitted at 3 am. Unable to drink anything even water. B6 not helping. Advised of 24 hour protocol of keeping food/fluids down & report to ED if unable to do so. Recommended ice chips,popsicles. Patient has been unable to attend work or school. Requesting note.

## 2019-09-04 ENCOUNTER — Other Ambulatory Visit: Payer: Self-pay | Admitting: Advanced Practice Midwife

## 2019-09-04 LAB — MONITOR DRUG PROFILE 10(MW)
Amphetamine Scrn, Ur: NEGATIVE ng/mL
BARBITURATE SCREEN URINE: NEGATIVE ng/mL
BENZODIAZEPINE SCREEN, URINE: NEGATIVE ng/mL
Cocaine (Metab) Scrn, Ur: NEGATIVE ng/mL
Creatinine(Crt), U: 107.1 mg/dL (ref 20.0–300.0)
Methadone Screen, Urine: NEGATIVE ng/mL
OXYCODONE+OXYMORPHONE UR QL SCN: NEGATIVE ng/mL
Opiate Scrn, Ur: NEGATIVE ng/mL
Ph of Urine: 6.6 (ref 4.5–8.9)
Phencyclidine Qn, Ur: NEGATIVE ng/mL
Propoxyphene Scrn, Ur: NEGATIVE ng/mL

## 2019-09-04 LAB — CANNABINOID (GC/MS), URINE
Cannabinoid: POSITIVE — AB
Carboxy THC (GC/MS): 39 ng/mL

## 2019-09-04 MED ORDER — METOCLOPRAMIDE HCL 10 MG PO TABS
10.00 | ORAL_TABLET | ORAL | Status: DC
Start: 2019-09-05 — End: 2019-09-04

## 2019-09-04 MED ORDER — PROMETHAZINE HCL 25 MG PO TABS
12.50 | ORAL_TABLET | ORAL | Status: DC
Start: ? — End: 2019-09-04

## 2019-09-04 MED ORDER — MICONAZOLE NITRATE 200 MG VA SUPP
200.00 | VAGINAL | Status: DC
Start: 2019-09-04 — End: 2019-09-04

## 2019-09-04 MED ORDER — FAMOTIDINE 20 MG PO TABS
20.00 | ORAL_TABLET | ORAL | Status: DC
Start: 2019-09-04 — End: 2019-09-04

## 2019-09-04 MED ORDER — ACETAMINOPHEN 325 MG PO TABS
650.00 | ORAL_TABLET | ORAL | Status: DC
Start: ? — End: 2019-09-04

## 2019-09-04 MED ORDER — SODIUM CHLORIDE FLUSH 0.9 % IV SOLN
5.00 | INTRAVENOUS | Status: DC
Start: ? — End: 2019-09-04

## 2019-09-04 MED ORDER — FOLIC ACID 1 MG PO TABS
1.00 | ORAL_TABLET | ORAL | Status: DC
Start: ? — End: 2019-09-04

## 2019-09-04 NOTE — Progress Notes (Unsigned)
Received after hours call from answering service regarding [facility] discharging patient and requesting follow up ultrasound. Review of chart- patient has follow up ultrasound scheduled already on 09/15/19.   Tresea Mall, CNM

## 2019-09-06 ENCOUNTER — Other Ambulatory Visit: Payer: Medicaid Other

## 2019-09-07 ENCOUNTER — Ambulatory Visit (INDEPENDENT_AMBULATORY_CARE_PROVIDER_SITE_OTHER): Payer: Medicaid Other | Admitting: Obstetrics and Gynecology

## 2019-09-07 ENCOUNTER — Other Ambulatory Visit: Payer: Self-pay

## 2019-09-07 VITALS — BP 107/69 | Wt 115.0 lb

## 2019-09-07 DIAGNOSIS — Z6791 Unspecified blood type, Rh negative: Secondary | ICD-10-CM

## 2019-09-07 DIAGNOSIS — O468X1 Other antepartum hemorrhage, first trimester: Secondary | ICD-10-CM

## 2019-09-07 DIAGNOSIS — O209 Hemorrhage in early pregnancy, unspecified: Secondary | ICD-10-CM

## 2019-09-07 DIAGNOSIS — O26899 Other specified pregnancy related conditions, unspecified trimester: Secondary | ICD-10-CM

## 2019-09-07 DIAGNOSIS — O418X10 Other specified disorders of amniotic fluid and membranes, first trimester, not applicable or unspecified: Secondary | ICD-10-CM

## 2019-09-07 DIAGNOSIS — Z3A01 Less than 8 weeks gestation of pregnancy: Secondary | ICD-10-CM

## 2019-09-07 NOTE — Progress Notes (Signed)
Obstetric Problem Visit    Chief Complaint:  Chief Complaint  Patient presents with  . ROB    History of Present Illness: Patient is a 19 y.o. G1P0 [redacted]w[redacted]d presenting for first trimester bleeding.  The onset of bleeding was 2 weeks ago.  Is bleeding equal to or greater than normal menstrual flow:  No Any recent trauma:  No Recent intercourse:  No History of prior miscarriage:  No Prior ultrasound demonstrating IUP: Yes Prior ultrasound demonstrating viable IUP:  Yes ER visit 09/06/2019 Prior Serum HCG:  Yes Rh status: O NEG   Review of Systems: ROS  Past Medical History:  Patient Active Problem List   Diagnosis Date Noted  . Polyarthralgia 03/11/2018  . Rheumatoid factor positive 03/11/2018    Formatting of this note might be different from the original. RF positive 02/2018 at 22 (normal 0-13.9) Negative ANA (02/2018)     Past Surgical History:  Past Surgical History:  Procedure Laterality Date  . TONSILLECTOMY AND ADENOIDECTOMY      Obstetric History: G1P0  Family History:  Family History  Problem Relation Age of Onset  . Anxiety disorder Mother     Social History:  Social History   Socioeconomic History  . Marital status: Single    Spouse name: Not on file  . Number of children: Not on file  . Years of education: Not on file  . Highest education level: Not on file  Occupational History  . Not on file  Tobacco Use  . Smoking status: Former Smoker    Types: Cigarettes  . Smokeless tobacco: Never Used  Substance and Sexual Activity  . Alcohol use: No  . Drug use: Yes    Types: Marijuana  . Sexual activity: Yes    Partners: Male    Birth control/protection: None  Other Topics Concern  . Not on file  Social History Narrative  . Not on file   Social Determinants of Health   Financial Resource Strain:   . Difficulty of Paying Living Expenses:   Food Insecurity:   . Worried About Charity fundraiser in the Last Year:   . Arboriculturist in the  Last Year:   Transportation Needs:   . Film/video editor (Medical):   Marland Kitchen Lack of Transportation (Non-Medical):   Physical Activity:   . Days of Exercise per Week:   . Minutes of Exercise per Session:   Stress:   . Feeling of Stress :   Social Connections:   . Frequency of Communication with Friends and Family:   . Frequency of Social Gatherings with Friends and Family:   . Attends Religious Services:   . Active Member of Clubs or Organizations:   . Attends Archivist Meetings:   Marland Kitchen Marital Status:   Intimate Partner Violence:   . Fear of Current or Ex-Partner:   . Emotionally Abused:   Marland Kitchen Physically Abused:   . Sexually Abused:     Allergies:  No Known Allergies  Medications: Prior to Admission medications   Medication Sig Start Date End Date Taking? Authorizing Provider  Prenatal Vit-Fe Fumarate-FA (MULTIVITAMIN-PRENATAL) 27-0.8 MG TABS tablet Take 1 tablet by mouth daily at 12 noon.    [provider]  promethazine (PHENERGAN) 12.5 MG tablet Take 1 tablet (12.5 mg total) by mouth every 6 (six) hours as needed for nausea or vomiting. 09/02/19   Malachy Mood, MD  pyridOXINE (VITAMIN B-6) 25 MG tablet Take by mouth. 08/31/19 08/30/20  [provider]  terconazole (TERAZOL 7) 0.4 % vaginal cream Place 1 applicator vaginally at bedtime. Patient not taking: Reported on 09/01/2019 08/29/19   Natale Milch, MD    Physical Exam Vitals: Blood pressure 107/69, weight 115 lb (52.2 kg), last menstrual period 07/24/2019.  General: NAD HEENT: normocephalic, anicteric Pulmonary: No increased work of breathing, Genitourinary:  External: Normal external female genitalia.  Normal urethral meatus, normal Bartholin's and Skene's glands.    Vagina: Normal vaginal mucosa, no evidence of prolapse.    Cervix: closed  Uterus: non-enlarged  Adnexa: ovaries non-enlarged, no adnexal masses  Rectal: deferred Extremities: no edema, erythema, or  tenderness Neurologic: Grossly intact Psychiatric: mood appropriate, affect full  Female chaperone present for pelvic exam and ultrasound evaluation  TVUS singleton viable intrauterine pregnancy with CRL [redacted]w[redacted]d FHT 118  Assessment: 19 y.o. G1P0 [redacted]w[redacted]d presenting for evaluation of first trimester vaginal bleeding  Plan: Problem List Items Addressed This Visit    None      1) First trimester bleeding - incidence and clinical course of first trimester bleeding is discussed in detail with the patient today.  Approximately 1/3 of pregnancies ending in live births experienced 1st trimester bleeding.  The amount of bleeding is variable and not necessarily predictive of outcome.  Sources may be cervical or uterine.  Subchorionic hemorrhages are a frequent concurrent findings on ultrasound and are followed expectantly.  These often absorb or regress spontaneously although risk for expansion and further disruption of the utero-placental interface leading to miscarriage is possible.  There is no clearly documented benefit to limiting or modifying activity and sexual intercourse in altering clinic course of 1st trimester bleeding.    2) If no already done will proceed with TVUS evaluation to document viability, and if uncertain viability or absence of a demonstrable IUP (and no previous documentation of IUP) will trend HCG levels. - CRL [redacted]w[redacted]d FHT 118 today - Has formal US scheduled next week  3) The patient is O NEG  rhogam is therefore  indicated to decrease the risk rhesus alloimmunization and has been previously administered.    4) Routine bleeding precautions were discussed with the patient prior the conclusion of today's visit.    Vena Austria, MD, [facility], [facility]

## 2019-09-07 NOTE — Progress Notes (Signed)
ROB Dark blood.cramping

## 2019-09-15 ENCOUNTER — Other Ambulatory Visit: Payer: Self-pay

## 2019-09-15 ENCOUNTER — Ambulatory Visit (INDEPENDENT_AMBULATORY_CARE_PROVIDER_SITE_OTHER): Payer: Medicaid Other

## 2019-09-15 ENCOUNTER — Ambulatory Visit (INDEPENDENT_AMBULATORY_CARE_PROVIDER_SITE_OTHER): Payer: Medicaid Other | Admitting: Certified Nurse Midwife

## 2019-09-15 ENCOUNTER — Other Ambulatory Visit: Payer: Self-pay | Admitting: Obstetrics and Gynecology

## 2019-09-15 VITALS — BP 96/68 | Wt 113.6 lb

## 2019-09-15 DIAGNOSIS — O468X1 Other antepartum hemorrhage, first trimester: Secondary | ICD-10-CM

## 2019-09-15 DIAGNOSIS — Z6791 Unspecified blood type, Rh negative: Secondary | ICD-10-CM

## 2019-09-15 DIAGNOSIS — O3680X0 Pregnancy with inconclusive fetal viability, not applicable or unspecified: Secondary | ICD-10-CM | POA: Diagnosis not present

## 2019-09-15 DIAGNOSIS — Z3401 Encounter for supervision of normal first pregnancy, first trimester: Secondary | ICD-10-CM

## 2019-09-15 DIAGNOSIS — Z3400 Encounter for supervision of normal first pregnancy, unspecified trimester: Secondary | ICD-10-CM

## 2019-09-15 DIAGNOSIS — O26899 Other specified pregnancy related conditions, unspecified trimester: Secondary | ICD-10-CM

## 2019-09-15 DIAGNOSIS — O209 Hemorrhage in early pregnancy, unspecified: Secondary | ICD-10-CM | POA: Diagnosis not present

## 2019-09-15 DIAGNOSIS — O418X10 Other specified disorders of amniotic fluid and membranes, first trimester, not applicable or unspecified: Secondary | ICD-10-CM

## 2019-09-15 DIAGNOSIS — Z3480 Encounter for supervision of other normal pregnancy, unspecified trimester: Secondary | ICD-10-CM | POA: Insufficient documentation

## 2019-09-15 MED ORDER — METOCLOPRAMIDE HCL 10 MG PO TABS: 10.0000 mg | ORAL_TABLET | Freq: Three times a day (TID) | ORAL | 2 refills | Status: DC

## 2019-09-15 NOTE — Progress Notes (Signed)
ROB at 7wk4d: Has not had any vaginal bleeding this week. Very anxious about [facility] seen on last [place] 5/31. Continues to have nausea and vomiting and smell aversions. Rarely takes phenergan due to side effects Has Diclegis at home but has not taken this medication.   TVUS today: CRL 7wk3d with +FCA=152. [facility] measures 3.9cm x 0.7cm x 1.3cm (previously 1.1 x 1.2 x 0.7cm) BP 96/68 wt down 2# in 1 week (has lost 8# with pregnancy)  A: Viable  IUP at 7wk4d Hx of first trimester bleeding and SCH-no bleeding in the last week Nausea and vomiting of pregnancy  P: Recommend pelvic rest until no bleeding x 2 weeks ROB/ follow up scan in 2 weeks RX Reglan 10 mgm TID AC Recommend taking Diclegis 2 tabs at hs Recommend frequent snacks/ small meals and increase fluid intake

## 2019-09-21 ENCOUNTER — Ambulatory Visit (INDEPENDENT_AMBULATORY_CARE_PROVIDER_SITE_OTHER): Payer: Medicaid Other | Admitting: Obstetrics and Gynecology

## 2019-09-21 ENCOUNTER — Other Ambulatory Visit: Payer: Self-pay

## 2019-09-21 VITALS — BP 96/68 | Ht 64.0 in | Wt 113.8 lb

## 2019-09-21 DIAGNOSIS — Z3401 Encounter for supervision of normal first pregnancy, first trimester: Secondary | ICD-10-CM

## 2019-09-21 DIAGNOSIS — Z3A08 8 weeks gestation of pregnancy: Secondary | ICD-10-CM

## 2019-09-21 LAB — POCT URINALYSIS DIPSTICK OB
Glucose, UA: NEGATIVE
POC,PROTEIN,UA: NEGATIVE

## 2019-09-21 NOTE — Patient Instructions (Addendum)
Initial steps to help :   B6 (pyridoxine) 25 mg,  3-4 times a day- 200 mg a day total Unisom (doxylamine) 25 mg at bedtime **B6 and Unisom are available as a combination prescription medications called diclegis and bonjesta  B1 (thiamin)  50-100 mg 1-2 a day-  100 mg a day total  Continue prenatal vitamin with iron and thiamin. If it is not tolerated switch to 1 mg of folic acid.  Can add medication for gastric reflux if needed.  Subsequent steps to be added to B1, B6, and Unisom:  1. Antihistamine (one of the following medications) Dramamine      25-50 mg every 4-6 hours Benadryl      25-50 mg every 4-6 hours Meclizine      25 mg every 6 hours  2. Dopamine Antagonist (one of the following medications) Metoclopramide  (Reglan)  5-10 mg every 6-8 hours         PO Promethazine   (Phenergan)   12.5-25 mg every 4-6 hours      PO or rectal Prochlorperazine  (Compazine)  5-10 mg every 6-8 hours     25mg BID rectally   Subsequent steps if there has still not been improvement in symptoms:  3. Daily stool softner:  Colace 100 mg twice a day  4. Ondansetron  (Zofran)   4-8 mg every 6-8 hours     Hyperemesis Gravidarum Hyperemesis gravidarum is a severe form of nausea and vomiting that happens during pregnancy. Hyperemesis is worse than morning sickness. It may cause you to have nausea or vomiting all day for many days. It may keep you from eating and drinking enough food and liquids, which can lead to dehydration, malnutrition, and weight loss. Hyperemesis usually occurs during the first half (the first 20 weeks) of pregnancy. It often goes away once a woman is in her second half of pregnancy. However, sometimes hyperemesis continues through an entire pregnancy. What are the causes? The cause of this condition is not known. It may be related to changes in chemicals (hormones) in the body during pregnancy, such as the high level of pregnancy hormone (human chorionic gonadotropin) or the  increase in the female sex hormone (estrogen). What are the signs or symptoms? Symptoms of this condition include:  Nausea that does not go away.  Vomiting that does not allow you to keep any food down.  Weight loss.  Body fluid loss (dehydration).  Having no desire to eat, or not liking food that you have previously enjoyed. How is this diagnosed? This condition may be diagnosed based on:  A physical exam.  Your medical history.  Your symptoms.  Blood tests.  Urine tests. How is this treated? This condition is managed by controlling symptoms. This may include:  Following an eating plan. This can help lessen nausea and vomiting.  Taking prescription medicines. An eating plan and medicines are often used together to help control symptoms. If medicines do not help relieve nausea and vomiting, you may need to receive fluids through an IV at the hospital. Follow these instructions at home: Eating and drinking   Avoid the following: ? Drinking fluids with meals. Try not to drink anything during the 30 minutes before and after your meals. ? Drinking more than 1 cup of fluid at a time. ? Eating foods that trigger your symptoms. These may include spicy foods, coffee, high-fat foods, very sweet foods, and acidic foods. ? Skipping meals. Nausea can be more intense on an empty stomach.   If you cannot tolerate food, do not force it. Try sucking on ice chips or other frozen items and make up for missed calories later. ? Lying down within 2 hours after eating. ? Being exposed to environmental triggers. These may include food smells, smoky rooms, closed spaces, rooms with strong smells, warm or humid places, overly loud and noisy rooms, and rooms with motion or flickering lights. Try eating meals in a well-ventilated area that is free of strong smells. ? Quick and sudden changes in your movement. ? Taking iron pills and multivitamins that contain iron. If you take prescription iron pills,  do not stop taking them unless your health care provider approves. ? Preparing food. The smell of food can spoil your appetite or trigger nausea.  To help relieve your symptoms: ? Listen to your body. Everyone is different and has different preferences. Find what works best for you. ? Eat and drink slowly. ? Eat 5-6 small meals daily instead of 3 large meals. Eating small meals and snacks can help you avoid an empty stomach. ? In the morning, before getting out of bed, eat a couple of crackers to avoid moving around on an empty stomach. ? Try eating starchy foods as these are usually tolerated well. Examples include cereal, toast, bread, potatoes, pasta, rice, and pretzels. ? Include at least 1 serving of protein with your meals and snacks. Protein options include lean meats, poultry, seafood, beans, nuts, nut butters, eggs, cheese, and yogurt. ? Try eating a protein-rich snack before bed. Examples of a protein-rick snack include cheese and crackers or a peanut butter sandwich made with 1 slice of whole-wheat bread and 1 tsp (5 g) of peanut butter. ? Eat or suck on things that have ginger in them. It may help relieve nausea. Add  tsp ground ginger to hot tea or choose ginger tea. ? Try drinking 100% fruit juice or an electrolyte drink. An electrolyte drink contains sodium, potassium, and chloride. ? Drink fluids that are cold, clear, and carbonated or sour. Examples include lemonade, ginger ale, lemon-lime soda, ice water, and sparkling water. ? Brush your teeth or use a mouth rinse after meals. ? Talk with your health care provider about starting a supplement of vitamin B6. General instructions  Take over-the-counter and prescription medicines only as told by your health care provider.  Follow instructions from your health care provider about eating or drinking restrictions.  Continue to take your prenatal vitamins as told by your health care provider. If you are having trouble taking your  prenatal vitamins, talk with your health care provider about different options.  Keep all follow-up and pre-birth (prenatal) visits as told by your health care provider. This is important. Contact a health care provider if:  You have pain in your abdomen.  You have a severe headache.  You have vision problems.  You are losing weight.  You feel weak or dizzy. Get help right away if:  You cannot drink fluids without vomiting.  You vomit blood.  You have constant nausea and vomiting.  You are very weak.  You faint.  You have a fever and your symptoms suddenly get worse. Summary  Hyperemesis gravidarum is a severe form of nausea and vomiting that happens during pregnancy.  Making some changes to your eating habits may help relieve nausea and vomiting.  This condition may be managed with medicine.  If medicines do not help relieve nausea and vomiting, you may need to receive fluids through an IV at the  hospital. This information is not intended to replace advice given to you by your health care provider. Make sure you discuss any questions you have with your health care provider. Document Revised: 04/13/2017 Document Reviewed: 11/21/2015 Elsevier Patient Education  2020 [employer].    First Trimester of Pregnancy The first trimester of pregnancy is from week 1 until the end of week 13 (months 1 through 3). A week after a sperm fertilizes an egg, the egg will implant on the wall of the uterus. This embryo will begin to develop into a baby. Genes from you and your partner will form the baby. The female genes will determine whether the baby will be a boy or a girl. At 6-8 weeks, the eyes and face will be formed, and the heartbeat can be seen on ultrasound. At the end of 12 weeks, all the baby's organs will be formed. Now that you are pregnant, you will want to do everything you can to have a healthy baby. Two of the most important things are to get good prenatal care and to  follow your health care provider's instructions. Prenatal care is all the medical care you receive before the baby's birth. This care will help prevent, find, and treat any problems during the pregnancy and childbirth. Body changes during your first trimester Your body goes through many changes during pregnancy. The changes vary from woman to woman.  You may gain or lose a couple of pounds at first.  You may feel sick to your stomach (nauseous) and you may throw up (vomit). If the vomiting is uncontrollable, call your health care provider.  You may tire easily.  You may develop headaches that can be relieved by medicines. All medicines should be approved by your health care provider.  You may urinate more often. Painful urination may mean you have a bladder infection.  You may develop heartburn as a result of your pregnancy.  You may develop constipation because certain hormones are causing the muscles that push stool through your intestines to slow down.  You may develop hemorrhoids or swollen veins (varicose veins).  Your breasts may begin to grow larger and become tender. Your nipples may stick out more, and the tissue that surrounds them (areola) may become darker.  Your gums may bleed and may be sensitive to brushing and flossing.  Dark spots or blotches (chloasma, mask of pregnancy) may develop on your face. This will likely fade after the baby is born.  Your menstrual periods will stop.  You may have a loss of appetite.  You may develop cravings for certain kinds of food.  You may have changes in your emotions from day to day, such as being excited to be pregnant or being concerned that something may go wrong with the pregnancy and baby.  You may have more vivid and strange dreams.  You may have changes in your hair. These can include thickening of your hair, rapid growth, and changes in texture. Some women also have hair loss during or after pregnancy, or hair that feels  dry or thin. Your hair will most likely return to normal after your baby is born. What to expect at prenatal visits During a routine prenatal visit:  You will be weighed to make sure you and the baby are growing normally.  Your blood pressure will be taken.  Your abdomen will be measured to track your baby's growth.  The fetal heartbeat will be listened to between weeks 10 and 14 of your pregnancy.  Test results from any previous visits will be discussed. Your health care provider may ask you:  How you are feeling.  If you are feeling the baby move.  If you have had any abnormal symptoms, such as leaking fluid, bleeding, severe headaches, or abdominal cramping.  If you are using any tobacco products, including cigarettes, chewing tobacco, and electronic cigarettes.  If you have any questions. Other tests that may be performed during your first trimester include:  Blood tests to find your blood type and to check for the presence of any previous infections. The tests will also be used to check for low iron levels (anemia) and protein on red blood cells (Rh antibodies). Depending on your risk factors, or if you previously had diabetes during pregnancy, you may have tests to check for high blood sugar that affects pregnant women (gestational diabetes).  Urine tests to check for infections, diabetes, or protein in the urine.  An ultrasound to confirm the proper growth and development of the baby.  Fetal screens for spinal cord problems (spina bifida) and Down syndrome.  HIV (human immunodeficiency virus) testing. Routine prenatal testing includes screening for HIV, unless you choose not to have this test.  You may need other tests to make sure you and the baby are doing well. Follow these instructions at home: Medicines  Follow your health care provider's instructions regarding medicine use. Specific medicines may be either safe or unsafe to take during pregnancy.  Take a  prenatal vitamin that contains at least 600 micrograms (mcg) of folic acid.  If you develop constipation, try taking a stool softener if your health care provider approves. Eating and drinking   Eat a balanced diet that includes fresh fruits and vegetables, whole grains, good sources of protein such as meat, eggs, or tofu, and low-fat dairy. Your health care provider will help you determine the amount of weight gain that is right for you.  Avoid raw meat and uncooked cheese. These carry germs that can cause birth defects in the baby.  Eating four or five small meals rather than three large meals a day may help relieve nausea and vomiting. If you start to feel nauseous, eating a few soda crackers can be helpful. Drinking liquids between meals, instead of during meals, also seems to help ease nausea and vomiting.  Limit foods that are high in fat and processed sugars, such as fried and sweet foods.  To prevent constipation: ? Eat foods that are high in fiber, such as fresh fruits and vegetables, whole grains, and beans. ? Drink enough fluid to keep your urine clear or pale yellow. Activity  Exercise only as directed by your health care provider. Most women can continue their usual exercise routine during pregnancy. Try to exercise for 30 minutes at least 5 days a week. Exercising will help you: ? Control your weight. ? Stay in shape. ? Be prepared for labor and delivery.  Experiencing pain or cramping in the lower abdomen or lower back is a good sign that you should stop exercising. Check with your health care provider before continuing with normal exercises.  Try to avoid standing for long periods of time. Move your legs often if you must stand in one place for a long time.  Avoid heavy lifting.  Wear low-heeled shoes and practice good posture.  You may continue to have sex unless your health care provider tells you not to. Relieving pain and discomfort  Wear a good support bra to  relieve breast  tenderness.  Take warm sitz baths to soothe any pain or discomfort caused by hemorrhoids. Use hemorrhoid cream if your health care provider approves.  Rest with your legs elevated if you have leg cramps or low back pain.  If you develop varicose veins in your legs, wear support hose. Elevate your feet for 15 minutes, 3-4 times a day. Limit salt in your diet. Prenatal care  Schedule your prenatal visits by the twelfth week of pregnancy. They are usually scheduled monthly at first, then more often in the last 2 months before delivery.  Write down your questions. Take them to your prenatal visits.  Keep all your prenatal visits as told by your health care provider. This is important. Safety  Wear your seat belt at all times when driving.  Make a list of emergency phone numbers, including numbers for family, friends, the hospital, and police and fire departments. General instructions  Ask your health care provider for a referral to a local prenatal education class. Begin classes no later than the beginning of month 6 of your pregnancy.  Ask for help if you have counseling or nutritional needs during pregnancy. Your health care provider can offer advice or refer you to specialists for help with various needs.  Do not use hot tubs, steam rooms, or saunas.  Do not douche or use tampons or scented sanitary pads.  Do not cross your legs for long periods of time.  Avoid cat litter boxes and soil used by cats. These carry germs that can cause birth defects in the baby and possibly loss of the fetus by miscarriage or stillbirth.  Avoid all smoking, herbs, alcohol, and medicines not prescribed by your health care provider. Chemicals in these products affect the formation and growth of the baby.  Do not use any products that contain nicotine or tobacco, such as cigarettes and e-cigarettes. If you need help quitting, ask your health care provider. You may receive counseling support  and other resources to help you quit.  Schedule a dentist appointment. At home, brush your teeth with a soft toothbrush and be gentle when you floss. Contact a health care provider if:  You have dizziness.  You have mild pelvic cramps, pelvic pressure, or nagging pain in the abdominal area.  You have persistent nausea, vomiting, or diarrhea.  You have a bad smelling vaginal discharge.  You have pain when you urinate.  You notice increased swelling in your face, hands, legs, or ankles.  You are exposed to fifth disease or chickenpox.  You are exposed to [place] measles (rubella) and have never had it. Get help right away if:  You have a fever.  You are leaking fluid from your vagina.  You have spotting or bleeding from your vagina.  You have severe abdominal cramping or pain.  You have rapid weight gain or loss.  You vomit blood or material that looks like coffee grounds.  You develop a severe headache.  You have shortness of breath.  You have any kind of trauma, such as from a fall or a car accident. Summary  The first trimester of pregnancy is from week 1 until the end of week 13 (months 1 through 3).  Your body goes through many changes during pregnancy. The changes vary from woman to woman.  You will have routine prenatal visits. During those visits, your health care provider will examine you, discuss any test results you may have, and talk with you about how you are feeling. This information is  not intended to replace advice given to you by your health care provider. Make sure you discuss any questions you have with your health care provider. Document Revised: 03/06/2017 Document Reviewed: 03/05/2016 Elsevier Patient Education  2020 [employer].

## 2019-09-21 NOTE — Progress Notes (Signed)
Routine Prenatal Care Visit  Subjective  Susan Price is a 19 y.o. G1P0 at [redacted]w[redacted]d being seen today for ongoing prenatal care.  She is currently monitored for the following issues for this low-risk pregnancy and has Polyarthralgia; Rheumatoid factor positive; Encounter for supervision of normal first pregnancy in first trimester; Vaginal bleeding in pregnancy, first trimester; and Rh negative state in antepartum period on their problem list.  ----------------------------------------------------------------------------------- Patient reports no complaints.   Contractions: Not present. Vag. Bleeding: None.  Movement: Absent. Denies leaking of fluid.  ----------------------------------------------------------------------------------- The following portions of the patient's history were reviewed and updated as appropriate: allergies, current medications, past family history, past medical history, past social history, past surgical history and problem list. Problem list updated.   Objective  Blood pressure 96/68, height 5\' 4"  (1.626 m), weight 113 lb 12.8 oz (51.6 kg), last menstrual period 07/24/2019. Pregravid weight 122 lb (55.3 kg) Total Weight Gain -8 lb 3.2 oz (-3.719 kg) Urinalysis:      Fetal Status: Fetal Heart Rate (bpm): present   Movement: Absent     General:  Alert, oriented and cooperative. Patient is in no acute distress.  Skin: Skin is warm and dry. No rash noted.   Cardiovascular: Normal heart rate noted  Respiratory: Normal respiratory effort, no problems with respiration noted  Abdomen: Soft, gravid, appropriate for gestational age. Pain/Pressure: Absent     Pelvic:  Cervical exam deferred        Extremities: Normal range of motion.  Edema: None  Mental Status: Normal mood and affect. Normal behavior. Normal judgment and thought content.     Assessment   19 y.o. G1P0 at [redacted]w[redacted]d by  04/29/2020, by Last Menstrual Period presenting for routine prenatal visit  Plan    Pregnancy #1 Problems (from 08/29/19 to present)    Problem Noted Resolved   Encounter for supervision of normal first pregnancy in first trimester 09/15/2019 by Dalia Heading, CNM No   Overview Signed 09/21/2019  3:12 PM by Homero Fellers, MD     Nursing Staff Provider  Office Location  [facility] Dating   LMP = 7wk [place]  Language  English Anatomy US    Flu Vaccine   Genetic Screen  NIPS:   AFP:   First Screen:    TDaP vaccine    Hgb A1C or  GTT Early : Third trimester :   Rhogam  08/29/2019   LAB RESULTS   Feeding Plan  Blood Type --/--/O NEG (05/23 2240)   Contraception  Antibody NEG (05/23 2240)  Circumcision  Rubella    Pediatrician   RPR     Support Person  HBsAg     Prenatal Classes  HIV      Varicella   BTL Consent  GBS  (For PCN allergy, check sensitivities)        VBAC Consent  Pap   Under 21- postpartum [ ]      Hgb Electro      CF      SMA                 Bedside US, reassurance- FHR seen in 160s Discussed plan for hyperemesisi Given handout regarding seafood consumption in pregnancy 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  Gestational age appropriate obstetric precautions including but not limited to vaginal bleeding, contractions, leaking of fluid and fetal movement were reviewed in detail with the patient.    Return in about 1 week (around 09/28/2019)  for ROB in person.  Natale Milch MD [facility], [facility] 09/21/2019, 3:11 PM

## 2019-09-29 ENCOUNTER — Ambulatory Visit (INDEPENDENT_AMBULATORY_CARE_PROVIDER_SITE_OTHER): Payer: Medicaid Other

## 2019-09-29 ENCOUNTER — Ambulatory Visit (INDEPENDENT_AMBULATORY_CARE_PROVIDER_SITE_OTHER): Payer: Medicaid Other | Admitting: Certified Nurse Midwife

## 2019-09-29 ENCOUNTER — Other Ambulatory Visit: Payer: Self-pay

## 2019-09-29 VITALS — BP 90/60 | Wt 114.0 lb

## 2019-09-29 DIAGNOSIS — Z3401 Encounter for supervision of normal first pregnancy, first trimester: Secondary | ICD-10-CM

## 2019-09-29 DIAGNOSIS — Z3A09 9 weeks gestation of pregnancy: Secondary | ICD-10-CM | POA: Diagnosis not present

## 2019-09-29 DIAGNOSIS — O418X10 Other specified disorders of amniotic fluid and membranes, first trimester, not applicable or unspecified: Secondary | ICD-10-CM

## 2019-09-29 DIAGNOSIS — O468X1 Other antepartum hemorrhage, first trimester: Secondary | ICD-10-CM | POA: Diagnosis not present

## 2019-09-29 NOTE — Progress Notes (Signed)
No concerns.rj 

## 2019-09-29 NOTE — Progress Notes (Signed)
ROB at 9wk4d: No further bleeding in a few weeks. Feeling better.Desires cell free DNA test  Ultrasound today: CRL: 9wk4d with FCA =182. [facility] is smaller and measures 15.5 x 7.2 x 2.3 mm (was 39 x 7.4 x 13.2 mm)  A: IUP at 9wk4d S=D  P: ROB, MaterniT 21 and NOB labs as well as DT check in 1 week.  Farrel Conners, CNM

## 2019-10-03 ENCOUNTER — Other Ambulatory Visit: Payer: Self-pay

## 2019-10-03 ENCOUNTER — Emergency Department: Payer: Medicaid Other

## 2019-10-03 ENCOUNTER — Encounter: Payer: Self-pay | Admitting: Emergency Medicine

## 2019-10-03 ENCOUNTER — Emergency Department
Admission: EM | Admit: 2019-10-03 | Discharge: 2019-10-03 | Disposition: A | Discharge status: Home / Self Care | Payer: Medicaid Other | Attending: Emergency Medicine | Admitting: Emergency Medicine

## 2019-10-03 DIAGNOSIS — R1030 Lower abdominal pain, unspecified: Secondary | ICD-10-CM | POA: Insufficient documentation

## 2019-10-03 DIAGNOSIS — O26851 Spotting complicating pregnancy, first trimester: Secondary | ICD-10-CM | POA: Insufficient documentation

## 2019-10-03 DIAGNOSIS — Z3A10 10 weeks gestation of pregnancy: Secondary | ICD-10-CM | POA: Insufficient documentation

## 2019-10-03 DIAGNOSIS — N939 Abnormal uterine and vaginal bleeding, unspecified: Secondary | ICD-10-CM

## 2019-10-03 DIAGNOSIS — O26891 Other specified pregnancy related conditions, first trimester: Secondary | ICD-10-CM | POA: Insufficient documentation

## 2019-10-03 LAB — CBC
HCT: 36.5 % (ref 36.0–46.0)
Hemoglobin: 13 g/dL (ref 12.0–15.0)
MCH: 29.3 pg (ref 26.0–34.0)
MCHC: 35.6 g/dL (ref 30.0–36.0)
MCV: 82.4 fL (ref 80.0–100.0)
Platelets: 242 10*3/uL (ref 150–400)
RBC: 4.43 MIL/uL (ref 3.87–5.11)
RDW: 13.6 % (ref 11.5–15.5)
WBC: 7.3 10*3/uL (ref 4.0–10.5)
nRBC: 0 % (ref 0.0–0.2)

## 2019-10-03 LAB — BASIC METABOLIC PANEL
Anion gap: 9 (ref 5–15)
BUN: 10 mg/dL (ref 6–20)
CO2: 24 mmol/L (ref 22–32)
Calcium: 9.2 mg/dL (ref 8.9–10.3)
Chloride: 104 mmol/L (ref 98–111)
Creatinine, Ser: 0.53 mg/dL (ref 0.44–1.00)
GFR calc Af Amer: >60 mL/min (ref >60)
GFR calc non Af Amer: >60 mL/min (ref >60)
Glucose, Bld: 113 mg/dL — ABNORMAL HIGH (ref 70–99)
Potassium: 3.8 mmol/L (ref 3.5–5.1)
Sodium: 137 mmol/L (ref 135–145)

## 2019-10-03 LAB — HCG, QUANTITATIVE, PREGNANCY: hCG, Beta Chain, Quant, S: 290406 m[IU]/mL — ABNORMAL HIGH (ref <5)

## 2019-10-03 NOTE — ED Triage Notes (Signed)
Pt c/o lower abdominal cramping x1 day. Pt is [redacted] week pregnant with spotting as well. Pt seen for spotting on 6/24 at [facility].

## 2019-10-03 NOTE — ED Notes (Signed)
Pt announced she is leaving and will follow up with her obgyn tomorrow morning. States she is feeling much better after seeing her ultrasound.

## 2019-10-04 ENCOUNTER — Telehealth: Payer: Self-pay | Admitting: Emergency Medicine

## 2019-10-04 NOTE — Telephone Encounter (Signed)
Called patient due to lwot to inquire about condition and follow up plans. Says she goes to [facility], but has not called them yet today.  I encouraged her to call them and let them know how she is doing and that she has ultrasound results.

## 2019-10-05 ENCOUNTER — Ambulatory Visit (INDEPENDENT_AMBULATORY_CARE_PROVIDER_SITE_OTHER): Payer: Medicaid Other | Admitting: Obstetrics and Gynecology

## 2019-10-05 ENCOUNTER — Other Ambulatory Visit: Payer: Self-pay

## 2019-10-05 ENCOUNTER — Encounter: Payer: Self-pay | Admitting: Obstetrics and Gynecology

## 2019-10-05 VITALS — BP 100/70 | Wt 113.0 lb

## 2019-10-05 DIAGNOSIS — Z3401 Encounter for supervision of normal first pregnancy, first trimester: Secondary | ICD-10-CM

## 2019-10-05 DIAGNOSIS — O3680X0 Pregnancy with inconclusive fetal viability, not applicable or unspecified: Secondary | ICD-10-CM

## 2019-10-05 DIAGNOSIS — Z3A10 10 weeks gestation of pregnancy: Secondary | ICD-10-CM

## 2019-10-05 DIAGNOSIS — Z1379 Encounter for other screening for genetic and chromosomal anomalies: Secondary | ICD-10-CM

## 2019-10-05 LAB — POCT URINALYSIS DIPSTICK OB
Glucose, UA: NEGATIVE
POC,PROTEIN,UA: NEGATIVE

## 2019-10-05 NOTE — Progress Notes (Signed)
Routine Prenatal Care Visit  Subjective  Susan Price is a 19 y.o. G1P0 at [redacted]w[redacted]d being seen today for ongoing prenatal care.  She is currently monitored for the following issues for this low-risk pregnancy and has Polyarthralgia; Rheumatoid factor positive; Encounter for supervision of normal first pregnancy in first trimester; Vaginal bleeding in pregnancy, first trimester; and Rh negative state in antepartum period on their problem list.  ----------------------------------------------------------------------------------- Patient reports she was seen in the ED for pain and bleeding after intercourse.  Bleeding and pain have resolved. Contractions: Not present. Vag. Bleeding: Scant.  Movement: Absent. Denies leaking of fluid.  ----------------------------------------------------------------------------------- The following portions of the patient's history were reviewed and updated as appropriate: allergies, current medications, past family history, past medical history, past social history, past surgical history and problem list. Problem list updated.   Objective  Blood pressure 100/70, weight 113 lb (51.3 kg), last menstrual period 07/24/2019. Pregravid weight 122 lb (55.3 kg) Total Weight Gain -9 lb (-4.082 kg) Urinalysis:      Fetal Status: Fetal Heart Rate (bpm): 170   Movement: Absent     General:  Alert, oriented and cooperative. Patient is in no acute distress.  Skin: Skin is warm and dry. No rash noted.   Cardiovascular: Normal heart rate noted  Respiratory: Normal respiratory effort, no problems with respiration noted  Abdomen: Soft, gravid, appropriate for gestational age. Pain/Pressure: Absent     Pelvic:  Cervical exam deferred        Extremities: Normal range of motion.  Edema: None  Mental Status: Normal mood and affect. Normal behavior. Normal judgment and thought content.     Assessment   19 y.o. G1P0 at [redacted]w[redacted]d by  04/29/2020, by Last Menstrual Period presenting  for routine prenatal visit  Plan   Pregnancy #1 Problems (from 08/29/19 to present)    Problem Noted Resolved   Encounter for supervision of normal first pregnancy in first trimester 09/15/2019 by Farrel Conners, CNM No   Overview Signed 09/21/2019  3:12 PM by Natale Milch, MD     Nursing Staff Provider  Office Location  [facility] Dating   LMP = 7wk [place]  Language  English Anatomy US    Flu Vaccine   Genetic Screen  NIPS:   AFP:   First Screen:    TDaP vaccine    Hgb A1C or  GTT Early : Third trimester :   Rhogam  08/29/2019   LAB RESULTS   Feeding Plan  Blood Type --/--/O NEG (05/23 2240)   Contraception  Antibody NEG (05/23 2240)  Circumcision  Rubella    Pediatrician   RPR     Support Person  HBsAg     Prenatal Classes  HIV      Varicella   BTL Consent  GBS  (For PCN allergy, check sensitivities)        VBAC Consent  Pap   Under 21- postpartum [ ]      Hgb Electro      CF      SMA                  Maternit21 testing today NOB labs today  Gestational age appropriate obstetric precautions including but not limited to vaginal bleeding, contractions, leaking of fluid and fetal movement were reviewed in detail with the patient.    Return in about 1 week (around 10/12/2019) for ROB in person, weekly next 4 weeks.  12/13/2019 MD [facility], [facility]  [facility] 10/05/2019, 9:46 AM

## 2019-10-05 NOTE — Patient Instructions (Addendum)
[facility] [place], [employer] [place] Same Day Access Hours:  Monday, Wednesday and Friday, 8am - 3pm Walk-In Crisis Hours: 7 days/wk, 8am - 8pm [address], [place], [place] [address] [phone]  [facility] [employer] [phone]  Mobile Crisis Unit [phone]   Therapists/Counselors/Psychologists  [facility] [address] [facility] [address] [place], [place] [address] [phone]  Karen [place], [place]  & Jacqlyn Krauss [place]    [phone]        [address]       [place], [place] [address]        Ival Bible, CSW [phone] [address] [place], [place] [address]  Harle Battiest, [place]        [phone]        [address]      [place], [place] [address]        Chyrel Masson, MS [phone] [address] [place] [address] [place], [place] [address]   Oscar La, LMFT       [phone]        [address]       [place], [place] [address]        Felecia Jan [phone] [address] [place], [place] [address]  Lester Loris        [phone]        [address]       [place], [place] [address]        Kerin Salen [phone] [address] [place], [place] [address]  Tyron Russell, PsyD       [phone]        [address]       [place], [place] [address]        Elita Quick, LPC [phone] [address]  [place], [place] [address]   Debarah Crape        [phone]        [address] [place], [place] [address]         Rosana Hoes [facility] [phone] [email or web address]   Sation Konchella       [phone]        [address]       [place], [place] [address]        [facility] [phone] [email or web address]    Managing Anxiety, Adult After being diagnosed with an anxiety disorder, you may be relieved to know why you have felt or behaved a certain way. You may also  feel overwhelmed about the treatment ahead and what it will mean for your life. With care and support, you can manage this condition and recover from it. How to manage lifestyle changes Managing stress and anxiety  Stress is your body's reaction to life changes and events, both good and bad. Most stress will last just a few hours, but stress can be ongoing and can lead to more than just stress. Although stress can play a major role in anxiety, it is not the same as anxiety. Stress is usually caused by something external, such as a deadline, test, or competition. Stress normally passes after the triggering event has ended.  Anxiety is caused by something internal, such as imagining a terrible outcome or worrying that something will go wrong that will devastate you. Anxiety often does not go away even after the triggering event is over, and it can become long-term (chronic) worry.  It is important to understand the differences between stress and anxiety and to manage your stress effectively so that it does not lead to an anxious response. Talk with your health care provider or a counselor to learn more about reducing anxiety and stress. He or she may suggest tension reduction techniques, such as:  Music therapy. This can include creating or listening to music that you enjoy and that inspires you.  Mindfulness-based meditation. This involves being aware of your normal breaths while not trying to control your breathing. It can be done while sitting or walking.  Centering prayer. This involves focusing on a word, phrase, or sacred image that means something to you and brings you peace.  Deep breathing. To do this, expand your stomach and inhale slowly through your nose. Hold your breath for 3-5 seconds. Then exhale slowly, letting your stomach muscles relax.  Self-talk. This involves identifying thought patterns that lead to anxiety reactions and changing those patterns.  Muscle relaxation. This involves  tensing muscles and then relaxing them. Choose a tension reduction technique that suits your lifestyle and personality. These techniques take time and practice. Set aside 5-15 minutes a day to do them. Therapists can offer counseling and training in these techniques. The training to help with anxiety may be covered by some insurance plans. Other things you can do to manage stress and anxiety include:  Keeping a stress/anxiety diary. This can help you learn what triggers your reaction and then learn ways to manage your response.  Thinking about how you react to certain situations. You may not be able to control everything, but you can control your response.  Making time for activities that help you relax and not feeling guilty about spending your time in this way.  Visual imagery and yoga can help you stay calm and relax.  Medicines Medicines can help ease symptoms. Medicines for anxiety include:  Anti-anxiety drugs.  Antidepressants. Medicines are often used as a primary treatment for anxiety disorder. Medicines will be prescribed by a health care provider. When used together, medicines, psychotherapy, and tension reduction techniques may be the most effective treatment. Relationships Relationships can play a big part in helping you recover. Try to spend more time connecting with trusted friends and family members. Consider going to couples counseling, taking family education classes, or going to family therapy. Therapy can help you and others better understand your condition. How to recognize changes in your anxiety Everyone responds differently to treatment for anxiety. Recovery from anxiety happens when symptoms decrease and stop interfering with your daily activities at home or work. This may mean that you will start to:  Have better concentration and focus. Worry will interfere less in your daily thinking.  Sleep better.  Be less irritable.  Have more energy.  Have improved  memory. It is important to recognize when your condition is getting worse. Contact your health care provider if your symptoms interfere with home or work and you feel like your condition is not improving. Follow these instructions at home: Activity  Exercise. Most adults should do the following: ? Exercise for at least 150 minutes each week. The exercise should increase your heart rate and make you sweat (moderate-intensity exercise). ? Strengthening exercises at least twice a week.  Get the right amount and quality of sleep. Most adults need 7-9 hours of sleep each night. Lifestyle   Eat a healthy diet that includes plenty of vegetables, fruits, whole grains, low-fat dairy products, and lean protein. Do not eat  a lot of foods that are high in solid fats, added sugars, or salt.  Make choices that simplify your life.  Do not use any products that contain nicotine or tobacco, such as cigarettes, e-cigarettes, and chewing tobacco. If you need help quitting, ask your health care provider.  Avoid caffeine, alcohol, and certain over-the-counter cold medicines. These may make you feel worse. Ask your pharmacist which medicines to avoid. General instructions  Take over-the-counter and prescription medicines only as told by your health care provider.  Keep all follow-up visits as told by your health care provider. This is important. Where to find support You can get help and support from these sources:  Self-help groups.  Online and [employer].  A trusted spiritual leader.  Couples counseling.  Family education classes.  Family therapy. Where to find more information You may find that joining a support group helps you deal with your anxiety. The following sources can help you locate counselors or support groups near you:  Mental Health America: www.mentalhealthamerica.net  Anxiety and Depression Association of [place] (ADAA): ProgramCam.de  [employer] on Mental  Illness (NAMI): www.nami.org Contact a health care provider if you:  Have a hard time staying focused or finishing daily tasks.  Spend many hours a day feeling worried about everyday life.  Become exhausted by worry.  Start to have headaches, feel tense, or have nausea.  Urinate more than normal.  Have diarrhea. Get help right away if you have:  A racing heart and shortness of breath.  Thoughts of hurting yourself or others. If you ever feel like you may hurt yourself or others, or have thoughts about taking your own life, get help right away. You can go to your nearest emergency department or call:  Your local emergency services (911 in the [place]  A suicide crisis helpline, such as the National Suicide Prevention Lifeline at (520) 284-8076. This is open 24 hours a day. Summary  Taking steps to learn and use tension reduction techniques can help calm you and help prevent triggering an anxiety reaction.  When used together, medicines, psychotherapy, and tension reduction techniques may be the most effective treatment.  Family, friends, and partners can play a big part in helping you recover from an anxiety disorder. This information is not intended to replace advice given to you by your health care provider. Make sure you discuss any questions you have with your health care provider. Document Revised: 08/24/2018 Document Reviewed: 08/24/2018 Elsevier Patient Education  2020 [employer].    First Trimester of Pregnancy The first trimester of pregnancy is from week 1 until the end of week 13 (months 1 through 3). A week after a sperm fertilizes an egg, the egg will implant on the wall of the uterus. This embryo will begin to develop into a baby. Genes from you and your partner will form the baby. The female genes will determine whether the baby will be a boy or a girl. At 6-8 weeks, the eyes and face will be formed, and the heartbeat can be seen on ultrasound. At the end of 12  weeks, all the baby's organs will be formed. Now that you are pregnant, you will want to do everything you can to have a healthy baby. Two of the most important things are to get good prenatal care and to follow your health care provider's instructions. Prenatal care is all the medical care you receive before the baby's birth. This care will help prevent, find, and treat any problems during  the pregnancy and childbirth. Body changes during your first trimester Your body goes through many changes during pregnancy. The changes vary from woman to woman.  You may gain or lose a couple of pounds at first.  You may feel sick to your stomach (nauseous) and you may throw up (vomit). If the vomiting is uncontrollable, call your health care provider.  You may tire easily.  You may develop headaches that can be relieved by medicines. All medicines should be approved by your health care provider.  You may urinate more often. Painful urination may mean you have a bladder infection.  You may develop heartburn as a result of your pregnancy.  You may develop constipation because certain hormones are causing the muscles that push stool through your intestines to slow down.  You may develop hemorrhoids or swollen veins (varicose veins).  Your breasts may begin to grow larger and become tender. Your nipples may stick out more, and the tissue that surrounds them (areola) may become darker.  Your gums may bleed and may be sensitive to brushing and flossing.  Dark spots or blotches (chloasma, mask of pregnancy) may develop on your face. This will likely fade after the baby is born.  Your menstrual periods will stop.  You may have a loss of appetite.  You may develop cravings for certain kinds of food.  You may have changes in your emotions from day to day, such as being excited to be pregnant or being concerned that something may go wrong with the pregnancy and baby.  You may have more vivid and strange  dreams.  You may have changes in your hair. These can include thickening of your hair, rapid growth, and changes in texture. Some women also have hair loss during or after pregnancy, or hair that feels dry or thin. Your hair will most likely return to normal after your baby is born. What to expect at prenatal visits During a routine prenatal visit:  You will be weighed to make sure you and the baby are growing normally.  Your blood pressure will be taken.  Your abdomen will be measured to track your baby's growth.  The fetal heartbeat will be listened to between weeks 10 and 14 of your pregnancy.  Test results from any previous visits will be discussed. Your health care provider may ask you:  How you are feeling.  If you are feeling the baby move.  If you have had any abnormal symptoms, such as leaking fluid, bleeding, severe headaches, or abdominal cramping.  If you are using any tobacco products, including cigarettes, chewing tobacco, and electronic cigarettes.  If you have any questions. Other tests that may be performed during your first trimester include:  Blood tests to find your blood type and to check for the presence of any previous infections. The tests will also be used to check for low iron levels (anemia) and protein on red blood cells (Rh antibodies). Depending on your risk factors, or if you previously had diabetes during pregnancy, you may have tests to check for high blood sugar that affects pregnant women (gestational diabetes).  Urine tests to check for infections, diabetes, or protein in the urine.  An ultrasound to confirm the proper growth and development of the baby.  Fetal screens for spinal cord problems (spina bifida) and Down syndrome.  HIV (human immunodeficiency virus) testing. Routine prenatal testing includes screening for HIV, unless you choose not to have this test.  You may need other tests to make  sure you and the baby are doing well. Follow  these instructions at home: Medicines  Follow your health care provider's instructions regarding medicine use. Specific medicines may be either safe or unsafe to take during pregnancy.  Take a prenatal vitamin that contains at least 600 micrograms (mcg) of folic acid.  If you develop constipation, try taking a stool softener if your health care provider approves. Eating and drinking   Eat a balanced diet that includes fresh fruits and vegetables, whole grains, good sources of protein such as meat, eggs, or tofu, and low-fat dairy. Your health care provider will help you determine the amount of weight gain that is right for you.  Avoid raw meat and uncooked cheese. These carry germs that can cause birth defects in the baby.  Eating four or five small meals rather than three large meals a day may help relieve nausea and vomiting. If you start to feel nauseous, eating a few soda crackers can be helpful. Drinking liquids between meals, instead of during meals, also seems to help ease nausea and vomiting.  Limit foods that are high in fat and processed sugars, such as fried and sweet foods.  To prevent constipation: ? Eat foods that are high in fiber, such as fresh fruits and vegetables, whole grains, and beans. ? Drink enough fluid to keep your urine clear or pale yellow. Activity  Exercise only as directed by your health care provider. Most women can continue their usual exercise routine during pregnancy. Try to exercise for 30 minutes at least 5 days a week. Exercising will help you: ? Control your weight. ? Stay in shape. ? Be prepared for labor and delivery.  Experiencing pain or cramping in the lower abdomen or lower back is a good sign that you should stop exercising. Check with your health care provider before continuing with normal exercises.  Try to avoid standing for long periods of time. Move your legs often if you must stand in one place for a long time.  Avoid heavy  lifting.  Wear low-heeled shoes and practice good posture.  You may continue to have sex unless your health care provider tells you not to. Relieving pain and discomfort  Wear a good support bra to relieve breast tenderness.  Take warm sitz baths to soothe any pain or discomfort caused by hemorrhoids. Use hemorrhoid cream if your health care provider approves.  Rest with your legs elevated if you have leg cramps or low back pain.  If you develop varicose veins in your legs, wear support hose. Elevate your feet for 15 minutes, 3-4 times a day. Limit salt in your diet. Prenatal care  Schedule your prenatal visits by the twelfth week of pregnancy. They are usually scheduled monthly at first, then more often in the last 2 months before delivery.  Write down your questions. Take them to your prenatal visits.  Keep all your prenatal visits as told by your health care provider. This is important. Safety  Wear your seat belt at all times when driving.  Make a list of emergency phone numbers, including numbers for family, friends, the hospital, and police and fire departments. General instructions  Ask your health care provider for a referral to a local prenatal education class. Begin classes no later than the beginning of month 6 of your pregnancy.  Ask for help if you have counseling or nutritional needs during pregnancy. Your health care provider can offer advice or refer you to specialists for help with various needs.  Do not use hot tubs, steam rooms, or saunas.  Do not douche or use tampons or scented sanitary pads.  Do not cross your legs for long periods of time.  Avoid cat litter boxes and soil used by cats. These carry germs that can cause birth defects in the baby and possibly loss of the fetus by miscarriage or stillbirth.  Avoid all smoking, herbs, alcohol, and medicines not prescribed by your health care provider. Chemicals in these products affect the formation and  growth of the baby.  Do not use any products that contain nicotine or tobacco, such as cigarettes and e-cigarettes. If you need help quitting, ask your health care provider. You may receive counseling support and other resources to help you quit.  Schedule a dentist appointment. At home, brush your teeth with a soft toothbrush and be gentle when you floss. Contact a health care provider if:  You have dizziness.  You have mild pelvic cramps, pelvic pressure, or nagging pain in the abdominal area.  You have persistent nausea, vomiting, or diarrhea.  You have a bad smelling vaginal discharge.  You have pain when you urinate.  You notice increased swelling in your face, hands, legs, or ankles.  You are exposed to fifth disease or chickenpox.  You are exposed to [place] measles (rubella) and have never had it. Get help right away if:  You have a fever.  You are leaking fluid from your vagina.  You have spotting or bleeding from your vagina.  You have severe abdominal cramping or pain.  You have rapid weight gain or loss.  You vomit blood or material that looks like coffee grounds.  You develop a severe headache.  You have shortness of breath.  You have any kind of trauma, such as from a fall or a car accident. Summary  The first trimester of pregnancy is from week 1 until the end of week 13 (months 1 through 3).  Your body goes through many changes during pregnancy. The changes vary from woman to woman.  You will have routine prenatal visits. During those visits, your health care provider will examine you, discuss any test results you may have, and talk with you about how you are feeling. This information is not intended to replace advice given to you by your health care provider. Make sure you discuss any questions you have with your health care provider. Document Revised: 03/06/2017 Document Reviewed: 03/05/2016 Elsevier Patient Education  2020 [employer].

## 2019-10-05 NOTE — Addendum Note (Signed)
Addended by: Clement Husbands A on: 10/05/2019 10:46 AM   Modules accepted: Orders

## 2019-10-06 ENCOUNTER — Encounter: Payer: Medicaid Other | Admitting: Obstetrics

## 2019-10-07 LAB — RPR+RH+ABO+RUB AB+AB SCR+CB...
HIV Screen 4th Generation wRfx: NONREACTIVE
Hematocrit: 43.2 % (ref 34.0–46.6)
Hemoglobin: 14.2 g/dL (ref 11.1–15.9)
Hepatitis B Surface Ag: NEGATIVE
MCH: 29 pg (ref 26.6–33.0)
MCHC: 32.9 g/dL (ref 31.5–35.7)
MCV: 88 fL (ref 79–97)
Platelets: 253 10*3/uL (ref 150–450)
RBC: 4.89 x10E6/uL (ref 3.77–5.28)
RDW: 14.3 % (ref 11.7–15.4)
RPR Ser Ql: NONREACTIVE
Rh Factor: NEGATIVE
Rubella Antibodies, IGG: 12.7 index (ref >0.99)
Varicella zoster IgG: 430 index (ref >165)
WBC: 6.5 10*3/uL (ref 3.4–10.8)

## 2019-10-07 LAB — AB SCR+ANTIBODY ID: Antibody Screen: POSITIVE — AB

## 2019-10-12 ENCOUNTER — Other Ambulatory Visit: Payer: Self-pay

## 2019-10-12 ENCOUNTER — Ambulatory Visit (INDEPENDENT_AMBULATORY_CARE_PROVIDER_SITE_OTHER): Payer: Medicaid Other | Admitting: Obstetrics

## 2019-10-12 VITALS — BP 110/70 | Ht 64.0 in | Wt 115.0 lb

## 2019-10-12 DIAGNOSIS — Z3401 Encounter for supervision of normal first pregnancy, first trimester: Secondary | ICD-10-CM

## 2019-10-12 DIAGNOSIS — Z3A11 11 weeks gestation of pregnancy: Secondary | ICD-10-CM

## 2019-10-12 LAB — MATERNIT21 PLUS CORE+SCA
Fetal Fraction: 13
Monosomy X (Turner Syndrome): NOT DETECTED
Result (T21): NEGATIVE
Trisomy 13 (Patau syndrome): NEGATIVE
Trisomy 18 (Edwards syndrome): NEGATIVE
Trisomy 21 (Down syndrome): NEGATIVE
XXX (Triple X Syndrome): NOT DETECTED
XXY (Klinefelter Syndrome): NOT DETECTED
XYY (Jacobs Syndrome): NOT DETECTED

## 2019-10-12 LAB — POCT URINALYSIS DIPSTICK OB
Glucose, UA: NEGATIVE
POC,PROTEIN,UA: NEGATIVE

## 2019-10-12 NOTE — Progress Notes (Signed)
Routine Prenatal Care Visit  Subjective  Susan Price is a 19 y.o. G1P0 at [redacted]w[redacted]d being seen today for ongoing prenatal care.  She is currently monitored for the following issues for this low-risk pregnancy and has Polyarthralgia; Rheumatoid factor positive; Encounter for supervision of normal first pregnancy in first trimester; Vaginal bleeding in pregnancy, first trimester; and Rh negative state in antepartum period on their problem list.  ----------------------------------------------------------------------------------- Patient reports no complaints.   Contractions: Not present. Vag. Bleeding: None.  Movement: Absent. Leaking Fluid denies.  ----------------------------------------------------------------------------------- The following portions of the patient's history were reviewed and updated as appropriate: allergies, current medications, past family history, past medical history, past social history, past surgical history and problem list. Problem list updated.  Objective  Blood pressure 110/70, height 5\' 4"  (1.626 m), weight 115 lb (52.2 kg), last menstrual period 07/24/2019. Pregravid weight 122 lb (55.3 kg) Total Weight Gain -7 lb (-3.175 kg) Urinalysis: Urine Protein Negative  Urine Glucose Negative  Fetal Status:     Movement: Absent     General:  Alert, oriented and cooperative. Patient is in no acute distress.  Skin: Skin is warm and dry. No rash noted.   Cardiovascular: Normal heart rate noted  Respiratory: Normal respiratory effort, no problems with respiration noted  Abdomen: Soft, gravid, appropriate for gestational age. Pain/Pressure: Absent     Pelvic:  Cervical exam deferred        Extremities: Normal range of motion.  Edema: None  Mental Status: Normal mood and affect. Normal behavior. Normal judgment and thought content.   Assessment   19 y.o. G1P0 at [redacted]w[redacted]d by  04/29/2020, by Last Menstrual Period presenting for routine prenatal visit  Plan   Pregnancy #1  Problems (from 08/29/19 to present)    Problem Noted Resolved   Encounter for supervision of normal first pregnancy in first trimester 09/15/2019 by 11/15/2019, CNM No   Overview Addendum 10/12/2019 11:10 AM by 12/13/2019, CNM     Nursing Staff Provider  Office Location  [facility] Dating   LMP = 7wk Susan Price  Language  English Anatomy US    Flu Vaccine   Genetic Screen  NIPS:   AFP:   First Screen:  MaternT test negative  TDaP vaccine    Hgb A1C or  GTT Early : Third trimester :   Rhogam  08/29/2019   LAB RESULTS   Feeding Plan  Blood Type --/--/O NEG (05/23 2240)   Contraception  Antibody NEG (05/23 2240)  Circumcision  Rubella  immune  Pediatrician   RPR   NR  Support Person  HBsAg   negative  Prenatal Classes  HIV  negative    Varicella immune  BTL Consent  GBS  (For PCN allergy, check sensitivities)        VBAC Consent  Pap   Under 21- postpartum [ ]      Hgb Electro      CF      SMA               Previous Version       Preterm labor symptoms and general obstetric precautions including but not limited to vaginal bleeding, contractions, leaking of fluid and fetal movement were reviewed in detail with the patient. Please refer to After Visit Summary for other counseling recommendations.  We reviewed the MaternT testing and her OB labs. She is having a gender reveal party soon.  Return in about 4 weeks (around 11/09/2019) for return OB.   Eunice Blase, CNM  10/12/2019 11:32 AM

## 2019-10-14 ENCOUNTER — Telehealth: Payer: Self-pay

## 2019-10-14 ENCOUNTER — Other Ambulatory Visit: Payer: Self-pay | Admitting: Obstetrics

## 2019-10-14 DIAGNOSIS — O418X10 Other specified disorders of amniotic fluid and membranes, first trimester, not applicable or unspecified: Secondary | ICD-10-CM

## 2019-10-14 DIAGNOSIS — Z3401 Encounter for supervision of normal first pregnancy, first trimester: Secondary | ICD-10-CM

## 2019-10-14 NOTE — Telephone Encounter (Signed)
Contacted patient by phone- her bleeding is now just brown spotting. She has a subchorionic hemorrhage. Offered her a work in to check fhts. She admits to having purchased a doppler, and got FHTs in the 160s today. Feeling very reassured. We will repeat an ultrasound at her next appt. She verbalized understanding.

## 2019-10-14 NOTE — Telephone Encounter (Signed)
Pt calling; bleeding since last night all night and this am; is brown.  What to do?  [phone]  Pt states she passed a blood clot last night and has had brown blood coming out of her when she wipes.  Adv brown means it's old; nothing to do unless is bright red and flows like a period, then needs to be seen.  Pt states she has a subchorionic hematoma; one provider tells her one thing and another provider tells her another.  Pt just wants the truth.  States she would feel better if could have u/s each visit to monitor it.  Adv will send msg to [facility].

## 2019-10-18 ENCOUNTER — Encounter: Payer: Medicaid Other | Admitting: Obstetrics and Gynecology

## 2019-10-19 ENCOUNTER — Other Ambulatory Visit: Payer: Self-pay

## 2019-10-19 ENCOUNTER — Ambulatory Visit (INDEPENDENT_AMBULATORY_CARE_PROVIDER_SITE_OTHER): Payer: Medicaid Other | Admitting: Certified Nurse Midwife

## 2019-10-19 VITALS — BP 110/70 | Ht 64.0 in | Wt 116.4 lb

## 2019-10-19 DIAGNOSIS — Z3A12 12 weeks gestation of pregnancy: Secondary | ICD-10-CM

## 2019-10-19 DIAGNOSIS — Z3401 Encounter for supervision of normal first pregnancy, first trimester: Secondary | ICD-10-CM

## 2019-10-19 DIAGNOSIS — O209 Hemorrhage in early pregnancy, unspecified: Secondary | ICD-10-CM

## 2019-10-23 NOTE — Progress Notes (Signed)
ROB at 12wk3d: Spotted over the weekend, no bleeding in a few days. Hx of SCH. Had Rhogam 08/29/19 MaterniT21 was normal  Bedside ultrasound: Good FM. FCA 166 small 1.6cm SCH noted  A: IUP at 12wk3d with hx of [facility] and bleeding/spotting Reassuring FCA and fetal movement  P: ROB as scheduled and prn.  Farrel Conners, CNM

## 2019-10-26 ENCOUNTER — Encounter: Payer: Medicaid Other | Admitting: Obstetrics

## 2019-10-26 ENCOUNTER — Ambulatory Visit (INDEPENDENT_AMBULATORY_CARE_PROVIDER_SITE_OTHER): Payer: Medicaid Other | Admitting: Obstetrics

## 2019-10-26 ENCOUNTER — Other Ambulatory Visit: Payer: Self-pay

## 2019-10-26 VITALS — BP 112/64 | Wt 117.0 lb

## 2019-10-26 DIAGNOSIS — Z3401 Encounter for supervision of normal first pregnancy, first trimester: Secondary | ICD-10-CM

## 2019-10-26 DIAGNOSIS — Z3A13 13 weeks gestation of pregnancy: Secondary | ICD-10-CM

## 2019-10-26 NOTE — Progress Notes (Signed)
Routine Prenatal Care Visit  Subjective  Susan Price is a 19 y.o. G1P0 at [redacted]w[redacted]d being seen today for ongoing prenatal care.  She is currently monitored for the following issues for this low-risk pregnancy and has Polyarthralgia; Rheumatoid factor positive; Encounter for supervision of normal first pregnancy in first trimester; Vaginal bleeding in pregnancy, first trimester; and Rh negative state in antepartum period on their problem list.  ----------------------------------------------------------------------------------- Patient reports no complaints.   Contractions: Not present. Vag. Bleeding: None.  Movement: Present. Leaking Fluid denies.  ----------------------------------------------------------------------------------- The following portions of the patient's history were reviewed and updated as appropriate: allergies, current medications, past family history, past medical history, past social history, past surgical history and problem list. Problem list updated.  Objective  Blood pressure 112/64, weight 117 lb (53.1 kg), last menstrual period 07/24/2019. Pregravid weight 122 lb (55.3 kg) Total Weight Gain -5 lb (-2.268 kg) Urinalysis: Urine Protein    Urine Glucose    Fetal Status:     Movement: Present     General:  Alert, oriented and cooperative. Patient is in no acute distress.  Skin: Skin is warm and dry. No rash noted.   Cardiovascular: Normal heart rate noted  Respiratory: Normal respiratory effort, no problems with respiration noted  Abdomen: Soft, gravid, appropriate for gestational age. Pain/Pressure: Absent     Pelvic:  Cervical exam deferred        Extremities: Normal range of motion.     Mental Status: Normal mood and affect. Normal behavior. Normal judgment and thought content.   Assessment   19 y.o. G1P0 at [redacted]w[redacted]d by  04/29/2020, by Last Menstrual Period presenting for routine prenatal visit  Plan   Pregnancy #1 Problems (from 08/29/19 to present)    Problem  Noted Resolved   Encounter for supervision of normal first pregnancy in first trimester 09/15/2019 by Farrel Conners, CNM No   Overview Addendum 10/12/2019 11:10 AM by Mirna Mires, CNM     Nursing Staff Provider  Office Location  [facility] Dating   LMP = 7wk [place]  Language  English Anatomy US    Flu Vaccine   Genetic Screen  NIPS:   AFP:   First Screen:  MaternT test negative  TDaP vaccine    Hgb A1C or  GTT Early : Third trimester :   Rhogam  08/29/2019   LAB RESULTS   Feeding Plan  Blood Type --/--/O NEG (05/23 2240)   Contraception  Antibody NEG (05/23 2240)  Circumcision  Rubella  immune  Pediatrician   RPR   NR  Support Person  HBsAg   negative  Prenatal Classes  HIV  negative    Varicella immune  BTL Consent  GBS  (For PCN allergy, check sensitivities)        VBAC Consent  Pap   Under 21- postpartum [ ]      Hgb Electro      CF      SMA               Previous Version       Preterm labor symptoms and general obstetric precautions including but not limited to vaginal bleeding, contractions, leaking of fluid and fetal movement were reviewed in detail with the patient. Please refer to After Visit Summary for other counseling recommendations.   Reviewed the MaternT test result with patient- unknowingly she still had not had her gender reveal party- patient upset to learn the gender ahead of her party-huge apology given-   Return in  about 4 weeks (around 11/23/2019) for return OB.  Mirna Mires, CNM  10/26/2019 1:37 PM

## 2019-10-26 NOTE — Progress Notes (Signed)
No vb., no lof. Pt has had a little nausea, but manageable.

## 2019-11-02 ENCOUNTER — Encounter: Payer: Self-pay | Admitting: Obstetrics & Gynecology

## 2019-11-02 ENCOUNTER — Ambulatory Visit (INDEPENDENT_AMBULATORY_CARE_PROVIDER_SITE_OTHER): Payer: Medicaid Other | Admitting: Obstetrics & Gynecology

## 2019-11-02 ENCOUNTER — Other Ambulatory Visit: Payer: Self-pay

## 2019-11-02 VITALS — BP 100/60 | Wt 118.0 lb

## 2019-11-02 DIAGNOSIS — Z3402 Encounter for supervision of normal first pregnancy, second trimester: Secondary | ICD-10-CM

## 2019-11-02 DIAGNOSIS — Z3689 Encounter for other specified antenatal screening: Secondary | ICD-10-CM

## 2019-11-02 DIAGNOSIS — Z3A14 14 weeks gestation of pregnancy: Secondary | ICD-10-CM

## 2019-11-02 LAB — POCT URINALYSIS DIPSTICK OB
Glucose, UA: NEGATIVE
POC,PROTEIN,UA: NEGATIVE

## 2019-11-02 NOTE — Patient Instructions (Signed)

## 2019-11-02 NOTE — Progress Notes (Signed)
  Subjective  Fetal Movement? no Nausea? no Pain? no Vaginal Bleeding? no  Objective  BP (!) 100/60   Wt 118 lb (53.5 kg)   LMP 07/24/2019 (Exact Date)   BMI 20.25 kg/m  General: NAD Pumonary: no increased work of breathing Abdomen: gravid, non-tender Extremities: no edema Psychiatric: mood appropriate, affect full  Assessment  19 y.o. G1P0 at [redacted]w[redacted]d by  04/29/2020, by Last Menstrual Period presenting for routine prenatal visit  Plan   Problem List Items Addressed This Visit      Other   Encounter for supervision of normal first pregnancy in first trimester    Other Visit Diagnoses    [redacted] weeks gestation of pregnancy    -  Primary   Relevant Orders   POC Urinalysis Dipstick OB   Screening, antenatal, for fetal anatomic survey       Relevant Orders   US OB Comp + 14 Wk      Pregnancy #1 Problems (from 08/29/19 to present)    Problem Noted Resolved   Encounter for supervision of normal first pregnancy in first trimester 09/15/2019 by Farrel Conners, CNM No   Overview Addendum 10/12/2019 11:10 AM by Mirna Mires, CNM     Nursing Staff Provider  Office Location  [facility] Dating   LMP = 7wk [place]  Language  English Anatomy US    Flu Vaccine   Genetic Screen  NIPS:   AFP:   First Screen:  MaternT test negative  TDaP vaccine    Hgb A1C or  GTT Early : Third trimester :   Rhogam  08/29/2019   LAB RESULTS   Feeding Plan  Blood Type --/--/O NEG (05/23 2240)   Contraception  Antibody NEG (05/23 2240)  Circumcision  Rubella  immune  Pediatrician   RPR   NR  Support Person  HBsAg   negative  Prenatal Classes  HIV  negative    Varicella immune  BTL Consent  GBS  (For PCN allergy, check sensitivities)        VBAC Consent  Pap   Under 21- postpartum [ ]      Hgb Electro      CF      SMA               Previous Version     nv to assess [facility], also at 19 weeks to assess anatomy (scheduled)  PNV  [facility], MD, Annamarie Major Ob/Gyn, [facility] 11/02/2019  11:48 AM

## 2019-11-03 ENCOUNTER — Telehealth: Payer: Self-pay

## 2019-11-07 ENCOUNTER — Encounter: Payer: Medicaid Other | Admitting: Obstetrics and Gynecology

## 2019-11-08 ENCOUNTER — Ambulatory Visit (INDEPENDENT_AMBULATORY_CARE_PROVIDER_SITE_OTHER): Payer: Medicaid Other | Admitting: Certified Nurse Midwife

## 2019-11-08 ENCOUNTER — Other Ambulatory Visit: Payer: Self-pay

## 2019-11-08 VITALS — BP 90/60 | Temp 99.0°F | Wt 118.0 lb

## 2019-11-08 DIAGNOSIS — N750 Cyst of Bartholin's gland: Secondary | ICD-10-CM

## 2019-11-08 DIAGNOSIS — Z3A15 15 weeks gestation of pregnancy: Secondary | ICD-10-CM

## 2019-11-08 LAB — POCT URINALYSIS DIPSTICK OB
Glucose, UA: NEGATIVE
POC,PROTEIN,UA: NEGATIVE

## 2019-11-08 MED ORDER — CEPHALEXIN 500 MG PO CAPS: 500.0000 mg | ORAL_CAPSULE | Freq: Three times a day (TID) | ORAL | 0 refills | Status: DC

## 2019-11-08 NOTE — Progress Notes (Signed)
C/o bartholin's cyst - sore, painful, looks huge and deformed.

## 2019-11-09 ENCOUNTER — Encounter: Payer: Self-pay | Admitting: Obstetrics & Gynecology

## 2019-11-09 ENCOUNTER — Ambulatory Visit (INDEPENDENT_AMBULATORY_CARE_PROVIDER_SITE_OTHER): Payer: Medicaid Other | Admitting: Obstetrics & Gynecology

## 2019-11-09 ENCOUNTER — Other Ambulatory Visit: Payer: Self-pay | Admitting: Obstetrics

## 2019-11-09 ENCOUNTER — Ambulatory Visit (INDEPENDENT_AMBULATORY_CARE_PROVIDER_SITE_OTHER): Payer: Medicaid Other

## 2019-11-09 VITALS — BP 90/60 | Wt 118.0 lb

## 2019-11-09 DIAGNOSIS — Z3401 Encounter for supervision of normal first pregnancy, first trimester: Secondary | ICD-10-CM

## 2019-11-09 DIAGNOSIS — O418X10 Other specified disorders of amniotic fluid and membranes, first trimester, not applicable or unspecified: Secondary | ICD-10-CM

## 2019-11-09 DIAGNOSIS — Z3A15 15 weeks gestation of pregnancy: Secondary | ICD-10-CM

## 2019-11-09 DIAGNOSIS — O468X1 Other antepartum hemorrhage, first trimester: Secondary | ICD-10-CM

## 2019-11-09 DIAGNOSIS — Z3402 Encounter for supervision of normal first pregnancy, second trimester: Secondary | ICD-10-CM

## 2019-11-09 DIAGNOSIS — O468X2 Other antepartum hemorrhage, second trimester: Secondary | ICD-10-CM

## 2019-11-09 DIAGNOSIS — O418X20 Other specified disorders of amniotic fluid and membranes, second trimester, not applicable or unspecified: Secondary | ICD-10-CM | POA: Diagnosis not present

## 2019-11-09 NOTE — Progress Notes (Signed)
  Subjective  Follow up US today for prior [facility] No reported VB or pain  Objective  BP 90/60   Wt 118 lb (53.5 kg)   LMP 07/24/2019 (Exact Date)   BMI 20.25 kg/m  General: NAD Pumonary: no increased work of breathing Abdomen: gravid, non-tender Extremities: no edema Psychiatric: mood appropriate, affect full  Assessment  19 y.o. G1P0 at [redacted]w[redacted]d by  04/29/2020, by Last Menstrual Period presenting for routine prenatal visit  Plan   Problem List Items Addressed This Visit    None    Visit Diagnoses    [redacted] weeks gestation of pregnancy    -  Primary   Encounter for supervision of normal first pregnancy in second trimester        Review of ULTRASOUND.    I have personally reviewed images and report of recent ultrasound done at [facility].    Plan of management to be discussed with patient. No SCH seen   Annamarie Major, MD, Merlinda Frederick Ob/Gyn, [facility] 11/09/2019  11:36 AM

## 2019-11-10 ENCOUNTER — Telehealth: Payer: Self-pay

## 2019-11-10 NOTE — Telephone Encounter (Signed)
Pt aware.

## 2019-11-10 NOTE — Telephone Encounter (Signed)
Rita, I would lean toward her taking the antibiotic for at least 5 days. If she wants to wait and see how things go, that is OK. Jill Side

## 2019-11-10 NOTE — Telephone Encounter (Signed)
Pt calling; was seen yesterday; bartholin's cyst; has taken a few baths; cyst looks like it has drained completely on it's own; does she still need to take the antibx?  [phone]

## 2019-11-12 NOTE — Progress Notes (Signed)
19 year old G1P0 at 15wk2d present with complaints of a tender swelling to the right side of vaginal opening x1week. Has been taking sitz baths. Has noticed more vaginal discharge  Exam: Vulva: Right lower labia majora is visibly swollen, displacing right lower labia minora to the left.  There is a small amount of swelling in the area of the Bartholin gland on the right, and there is a white cheesy discharge than can be expressed from the Bartholin's gland  Vagina: white cheesy discharge noted Wet prep is negative for hyphae, Trich or clue cells  A; Probable right Bartholin cyst-now seems to be draining  P: Continue sitz baths Keflex 500 mgm tid x 7days Follow up in 1 week  Farrel Conners, CNM

## 2019-11-15 ENCOUNTER — Other Ambulatory Visit: Payer: Self-pay

## 2019-11-15 ENCOUNTER — Ambulatory Visit: Payer: Medicaid Other | Admitting: Certified Nurse Midwife

## 2019-11-18 ENCOUNTER — Encounter: Payer: Medicaid Other | Admitting: Obstetrics and Gynecology

## 2019-12-07 ENCOUNTER — Ambulatory Visit (INDEPENDENT_AMBULATORY_CARE_PROVIDER_SITE_OTHER): Payer: Medicaid Other

## 2019-12-07 ENCOUNTER — Other Ambulatory Visit: Payer: Self-pay

## 2019-12-07 ENCOUNTER — Ambulatory Visit (INDEPENDENT_AMBULATORY_CARE_PROVIDER_SITE_OTHER): Payer: Medicaid Other | Admitting: Obstetrics & Gynecology

## 2019-12-07 ENCOUNTER — Encounter: Payer: Self-pay | Admitting: Obstetrics & Gynecology

## 2019-12-07 VITALS — BP 120/80 | Wt 126.0 lb

## 2019-12-07 DIAGNOSIS — Z3402 Encounter for supervision of normal first pregnancy, second trimester: Secondary | ICD-10-CM

## 2019-12-07 DIAGNOSIS — Z3A19 19 weeks gestation of pregnancy: Secondary | ICD-10-CM

## 2019-12-07 DIAGNOSIS — Z131 Encounter for screening for diabetes mellitus: Secondary | ICD-10-CM

## 2019-12-07 DIAGNOSIS — Z3689 Encounter for other specified antenatal screening: Secondary | ICD-10-CM

## 2019-12-07 DIAGNOSIS — Z3401 Encounter for supervision of normal first pregnancy, first trimester: Secondary | ICD-10-CM

## 2019-12-07 NOTE — Patient Instructions (Signed)

## 2019-12-07 NOTE — Progress Notes (Signed)
  Subjective  Fetal Movement? yes Contractions? no Leaking Fluid? no Vaginal Bleeding? no  Objective  BP 120/80   Wt 126 lb (57.2 kg)   LMP 07/24/2019 (Exact Date)   BMI 21.63 kg/m  General: NAD Pumonary: no increased work of breathing Abdomen: gravid, non-tender Extremities: no edema Psychiatric: mood appropriate, affect full  Review of ULTRASOUND. I have personally reviewed images and report of recent ultrasound done at [facility]. There is a singleton gestation with subjectively normal amniotic fluid volume. The fetal biometry correlates with established dating. Detailed evaluation of the fetal anatomy was performed.The fetal anatomical survey appears within normal limits within the resolution of ultrasound as described above.  It must be noted that a normal ultrasound is unable to rule out fetal aneuploidy.    Assessment  19 y.o. G1P0 at [redacted]w[redacted]d by  04/29/2020, by Last Menstrual Period presenting for routine prenatal visit  Plan   Problem List Items Addressed This Visit    None    Visit Diagnoses    [redacted] weeks gestation of pregnancy    -  Primary   Encounter for supervision of normal first pregnancy in second trimester       Screening for diabetes mellitus       Relevant Orders   28 Weeks RH-Panel      Pregnancy #1 Problems (from 08/29/19 to present)    Problem Noted Resolved   Encounter for supervision of normal first pregnancy in first trimester 09/15/2019 by Farrel Conners, CNM No   Overview Addendum 10/12/2019 11:10 AM by Mirna Mires, CNM     Nursing Staff Provider  Office Location  [facility] Dating   LMP = 7wk [place]  Language  English Anatomy US    Flu Vaccine  declines Genetic Screen  NIPS:  nml XY  TDaP vaccine    Hgb A1C or  GTT Third trimester :   Rhogam  08/29/2019   LAB RESULTS   Feeding Plan  Blood Type --/--/O NEG (05/23 2240)   Contraception  Antibody NEG (05/23 2240)  Circumcision  Rubella  immune  Pediatrician   RPR   NR  Support Person BF HBsAg    negative  Prenatal Classes  HIV  negative    Varicella immune  BTL Consent no GBS  (For PCN allergy, check sensitivities)        VBAC Consent n/a Pap   Under 21- postpartum [ ]      Hgb Electro      CF      SMA              PNV Declines flu shot  , MD, Annamarie Major Ob/Gyn, [facility] 12/07/2019  11:27 AM

## 2019-12-14 ENCOUNTER — Encounter: Payer: Medicaid Other | Admitting: Obstetrics and Gynecology

## 2019-12-16 ENCOUNTER — Ambulatory Visit (INDEPENDENT_AMBULATORY_CARE_PROVIDER_SITE_OTHER): Payer: Medicaid Other | Admitting: Obstetrics & Gynecology

## 2019-12-16 ENCOUNTER — Other Ambulatory Visit: Payer: Self-pay

## 2019-12-16 VITALS — BP 120/80 | Wt 130.0 lb

## 2019-12-16 DIAGNOSIS — R300 Dysuria: Secondary | ICD-10-CM

## 2019-12-16 LAB — POCT URINALYSIS DIPSTICK
Bilirubin, UA: NEGATIVE
Blood, UA: NEGATIVE
Glucose, UA: NEGATIVE
Ketones, UA: NEGATIVE
Leukocytes, UA: NEGATIVE
Nitrite, UA: NEGATIVE
Protein, UA: NEGATIVE
Spec Grav, UA: 1.01 (ref 1.010–1.025)
Urobilinogen, UA: 0.2 E.U./dL
pH, UA: 5 (ref 5.0–8.0)

## 2019-12-16 NOTE — Progress Notes (Signed)
  HPI:      Ms. Susan Price is a 19 y.o. G1P0 who LMP was Patient's last menstrual period was 07/24/2019 (exact date)., presents today for a problem visit.    Urinary Tract Symptoms: Patient complains of burning with urination . She has had symptoms for 2 days. Patient also complains of no other sx's; denies vag bleeding or pain in abdomen.  She is [redacted] weeks pregnant.. Patient denies back pain and fever. Sx's have improved today w hydration.  Patient does have a history of recurrent UTI.  Patient does not have a history of pyelonephritis.   PMHx: She  has a past medical history of Depression. Also,  has a past surgical history that includes Tonsillectomy and adenoidectomy., family history includes Anxiety disorder in her mother.,  reports that she has quit smoking. Her smoking use included cigarettes. She has never used smokeless tobacco. She reports current drug use. Drug: Marijuana. She reports that she does not drink alcohol.  She has a current medication list which includes the following prescription(s): multivitamin-prenatal and cephalexin. Also, has No Known Allergies.  Review of Systems  All other systems reviewed and are negative.   Objective: BP 120/80   Wt 130 lb (59 kg)   LMP 07/24/2019 (Exact Date)   BMI 22.31 kg/m  Physical Exam Constitutional:      General: She is not in acute distress.    Appearance: She is well-developed.  Abdominal:     Comments: FHT 140  Back no CVAT  Musculoskeletal:        General: Normal range of motion.  Neurological:     Mental Status: She is alert and oriented to person, place, and time.  Skin:    General: Skin is warm and dry.  Vitals reviewed.    UA NEG  ASSESSMENT/PLAN:   Dehydration, urinary check, no s/sx UTI  Problem List Items Addressed This Visit    Dysuria    -    Relevant Orders   POCT urinalysis dipstick (Completed)    Cont hydration  Annamarie Major, MD, Merlinda Frederick Ob/Gyn, [facility] 12/16/2019   4:59 PM

## 2020-01-02 ENCOUNTER — Telehealth: Payer: Self-pay

## 2020-01-02 NOTE — Telephone Encounter (Signed)
Pt called the after hour nurse 12/30/19 10:50pm c/o 23wks preg; feeling kicking in her lower pelvis, feels like she needs to urinate more freq; is having sl burning c urination.  Was told to be seen within 24hrs.  [phone]  [facility]

## 2020-01-04 ENCOUNTER — Encounter: Payer: Self-pay | Admitting: Obstetrics & Gynecology

## 2020-01-04 ENCOUNTER — Other Ambulatory Visit: Payer: Self-pay

## 2020-01-04 ENCOUNTER — Ambulatory Visit (INDEPENDENT_AMBULATORY_CARE_PROVIDER_SITE_OTHER): Payer: Medicaid Other | Admitting: Obstetrics & Gynecology

## 2020-01-04 VITALS — BP 100/60 | Wt 135.0 lb

## 2020-01-04 DIAGNOSIS — Z3A23 23 weeks gestation of pregnancy: Secondary | ICD-10-CM

## 2020-01-04 DIAGNOSIS — Z3402 Encounter for supervision of normal first pregnancy, second trimester: Secondary | ICD-10-CM

## 2020-01-04 NOTE — Progress Notes (Signed)
°  Subjective  Fetal Movement? yes Contractions? occas cramps Leaking Fluid? no Vaginal Bleeding? no  Objective  BP 100/60    Wt 135 lb (61.2 kg)    LMP 07/24/2019 (Exact Date)    BMI 23.17 kg/m  General: NAD Pumonary: no increased work of breathing Abdomen: gravid, non-tender Extremities: no edema Psychiatric: mood appropriate, affect full  Assessment  19 y.o. G1P0 at [redacted]w[redacted]d by  04/29/2020, by Last Menstrual Period presenting for routine prenatal visit  Plan   Problem List Items Addressed This Visit    None    Visit Diagnoses    Encounter for supervision of normal first pregnancy in second trimester    -  Primary   [redacted] weeks gestation of pregnancy          Pregnancy #1 Problems (from 08/29/19 to present)    Problem Noted Resolved   Encounter for supervision of normal first pregnancy in first trimester 09/15/2019 by Farrel Conners, CNM No   Overview Addendum 12/07/2019 11:29 AM by Nadara Mustard, MD     Nursing Staff Provider  Office Location  [facility] Dating   LMP = 7wk [place]  Language  English Anatomy US  [facility] nml  Flu Vaccine  Declines Genetic Screen  NIPS:  nml XY  TDaP vaccine    Hgb A1C or  GTT Third trimester :   Rhogam  08/29/2019   LAB RESULTS   Feeding Plan  Blood Type O/Negative/-- (06/30 1030)   Contraception  Antibody Positive, See Final Results (06/30 1030)  Circumcision  Rubella 12.70 (06/30 1030)immune  Pediatrician   RPR Non Reactive (06/30 1030) NR  Support Person BF HBsAg Negative (06/30 1030) negative  Prenatal Classes  HIV Non Reactive (06/30 1030)negative    Varicella immune  BTL Consent No GBS  (For PCN allergy, check sensitivities)        VBAC Consent N/A Pap   Under 21- postpartum [ ]      Hgb Electro      CF      SMA               Previous Version     PNV  Cervical check today as pt feels pressure at times and is worried      Reassured as has normal exam today      Sx's to watch for discussed.  Risks of PTL discussed.  ,  MD, Annamarie Major Ob/Gyn, [facility] 01/04/2020  2:25 PM

## 2020-01-04 NOTE — Telephone Encounter (Signed)
Pt has appt today c PH.

## 2020-01-04 NOTE — Patient Instructions (Signed)

## 2020-01-18 ENCOUNTER — Other Ambulatory Visit: Payer: Self-pay

## 2020-01-18 ENCOUNTER — Encounter: Payer: Self-pay | Admitting: Obstetrics and Gynecology

## 2020-01-18 ENCOUNTER — Observation Stay
Admission: EM | Admit: 2020-01-18 | Discharge: 2020-01-18 | Disposition: A | Discharge status: Home / Self Care | Payer: Medicaid Other | Attending: Obstetrics | Admitting: Obstetrics

## 2020-01-18 DIAGNOSIS — Z87891 Personal history of nicotine dependence: Secondary | ICD-10-CM | POA: Diagnosis not present

## 2020-01-18 DIAGNOSIS — O99891 Other specified diseases and conditions complicating pregnancy: Secondary | ICD-10-CM | POA: Diagnosis not present

## 2020-01-18 DIAGNOSIS — R109 Unspecified abdominal pain: Secondary | ICD-10-CM

## 2020-01-18 DIAGNOSIS — O26899 Other specified pregnancy related conditions, unspecified trimester: Secondary | ICD-10-CM

## 2020-01-18 DIAGNOSIS — Z3A25 25 weeks gestation of pregnancy: Secondary | ICD-10-CM | POA: Diagnosis not present

## 2020-01-18 DIAGNOSIS — Z3401 Encounter for supervision of normal first pregnancy, first trimester: Secondary | ICD-10-CM

## 2020-01-18 DIAGNOSIS — O368120 Decreased fetal movements, second trimester, not applicable or unspecified: Secondary | ICD-10-CM | POA: Diagnosis present

## 2020-01-18 NOTE — OB Triage Note (Signed)
Pt is a 19y/o G1P0 at [redacted]w[redacted]d with c/o decreased fetal movement since a dog jumped on pt belly about an hour ago and then began cramping and tightness. 2/10 on the pain skill. Pt denies LOF, CTX and VB. Monitors applied and assessing. Pt states she had intercourse last night. Pt denies urinary symptoms. Pt states she has had "some" water today. Initial FHT 145.

## 2020-01-18 NOTE — Discharge Instructions (Signed)
Fetal Movement Counts Patient Name: ________________________________________________ Patient Due Date: ____________________ What is a fetal movement count?  A fetal movement count is the number of times that you feel your baby move during a certain amount of time. This may also be called a fetal kick count. A fetal movement count is recommended for every pregnant woman. You may be asked to start counting fetal movements as early as week 28 of your pregnancy. Pay attention to when your baby is most active. You may notice your baby's sleep and wake cycles. You may also notice things that make your baby move more. You should do a fetal movement count:  When your baby is normally most active.  At the same time each day. A good time to count movements is while you are resting, after having something to eat and drink. How do I count fetal movements? 1. Find a quiet, comfortable area. Sit, or lie down on your side. 2. Write down the date, the start time and stop time, and the number of movements that you felt between those two times. Take this information with you to your health care visits. 3. Write down your start time when you feel the first movement. 4. Count kicks, flutters, swishes, rolls, and jabs. You should feel at least 10 movements. 5. You may stop counting after you have felt 10 movements, or if you have been counting for 2 hours. Write down the stop time. 6. If you do not feel 10 movements in 2 hours, contact your health care provider for further instructions. Your health care provider may want to do additional tests to assess your baby's well-being. Contact a health care provider if:  You feel fewer than 10 movements in 2 hours.  Your baby is not moving like he or she usually does. Date: ____________ Start time: ____________ Stop time: ____________ Movements: ____________ Date: ____________ Start time: ____________ Stop time: ____________ Movements: ____________ Date: ____________  Start time: ____________ Stop time: ____________ Movements: ____________ Date: ____________ Start time: ____________ Stop time: ____________ Movements: ____________ Date: ____________ Start time: ____________ Stop time: ____________ Movements: ____________ Date: ____________ Start time: ____________ Stop time: ____________ Movements: ____________ Date: ____________ Start time: ____________ Stop time: ____________ Movements: ____________ Date: ____________ Start time: ____________ Stop time: ____________ Movements: ____________ Date: ____________ Start time: ____________ Stop time: ____________ Movements: ____________ This information is not intended to replace advice given to you by your health care provider. Make sure you discuss any questions you have with your health care provider. Document Revised: 11/11/2018 Document Reviewed: 11/11/2018 Elsevier Patient Education  2020 [employer]  

## 2020-01-18 NOTE — Discharge Summary (Signed)
Please see final Progress Note. Mirna Mires, CNM  01/18/2020 9:08 PM

## 2020-01-18 NOTE — Final Progress Note (Signed)
Final Progress Note  Patient ID: Susan Price MRN: [id number] DOB/AGE: 2000/06/28 19 y.o.  Admit date: 01/18/2020 Admitting provider: Mirna Mires, CNM Discharge date: 01/18/2020   Admission Diagnoses: [redacted] weeks gestation of pregnancy Abdominal pain  Discharge Diagnoses:  Active Problems:   [redacted] weeks gestation of pregnancy  Reassuring fetal heart tones  History of Present Illness: The patient is a 19 y.o. female G1P0 at [redacted]w[redacted]d who presents for evalaution after her pit bull jumped on her abdomen this evening. She was a t home playing with the dog when he got excited and leaped onto her stomach. She denies any pain upon her arrival, but expresses a desire to hear fetal heart tones and be reassured. seh has felt limited fetal movement since the incident. She denies any vaginal bleeding or LOF.Marland Kitchenshe has not eaten in many hours. She denies dysuria and denies any contractions or cramping.   Past Medical History:  Diagnosis Date  . Depression     Past Surgical History:  Procedure Laterality Date  . TONSILLECTOMY AND ADENOIDECTOMY      No current facility-administered medications on file prior to encounter.   Current Outpatient Medications on File Prior to Encounter  Medication Sig Dispense Refill  . Prenatal Vit-Fe Fumarate-FA (MULTIVITAMIN-PRENATAL) 27-0.8 MG TABS tablet Take 1 tablet by mouth daily at 12 noon.    . cephALEXin (KEFLEX) 500 MG capsule Take 1 capsule (500 mg total) by mouth 3 (three) times daily. (Patient not taking: Reported on 12/16/2019) 21 capsule 0    No Known Allergies  Social History   Socioeconomic History  . Marital status: Single    Spouse name: Not on file  . Number of children: Not on file  . Years of education: Not on file  . Highest education level: Not on file  Occupational History  . Not on file  Tobacco Use  . Smoking status: Former Smoker    Types: Cigarettes  . Smokeless tobacco: Never Used  Vaping Use  . Vaping Use: Former   Substance and Sexual Activity  . Alcohol use: No  . Drug use: Not Currently    Types: Marijuana    Comment: a couple times during the beggingin of pregnancy]  . Sexual activity: Yes    Partners: Male    Birth control/protection: None  Other Topics Concern  . Not on file  Social History Narrative  . Not on file   Social Determinants of Health   Financial Resource Strain:   . Difficulty of Paying Living Expenses: Not on file  Food Insecurity:   . Worried About Programme researcher, broadcasting/film/video in the Last Year: Not on file  . Ran Out of Food in the Last Year: Not on file  Transportation Needs:   . Lack of Transportation (Medical): Not on file  . Lack of Transportation (Non-Medical): Not on file  Physical Activity:   . Days of Exercise per Week: Not on file  . Minutes of Exercise per Session: Not on file  Stress:   . Feeling of Stress : Not on file  Social Connections:   . Frequency of Communication with Friends and Family: Not on file  . Frequency of Social Gatherings with Friends and Family: Not on file  . Attends Religious Services: Not on file  . Active Member of Clubs or Organizations: Not on file  . Attends Banker Meetings: Not on file  . Marital Status: Not on file  Intimate Partner Violence:   . Fear of Current or  Ex-Partner: Not on file  . Emotionally Abused: Not on file  . Physically Abused: Not on file  . Sexually Abused: Not on file    Family History  Problem Relation Age of Onset  . Anxiety disorder Mother      ROS   Physical Exam: BP (!) 105/58 (BP Location: Left Arm)   Pulse 71   Temp 98.3 F (36.8 C) (Oral)   Resp 16   Ht 5\' 4"  (1.626 m)   Wt 63.5 kg   LMP 07/24/2019 (Exact Date)   BMI 24.03 kg/m   OBGyn Exam  Consults: None  Significant Findings/ Diagnostic Studies: none   Procedures: NST NST Baseline FHR: 130s,140s  beats/min Variability: moderate Accelerations: present Decelerations: absent Tocometry: some uterine  "irritability" noted  Interpretation:  INDICATIONS: decreased fetal movement RESULTS:  A NST procedure was performed with FHR monitoring and a normal baseline established, appropriate time of 20-40 minutes of evaluation, and accels >2 seen w 15x15 characteristics.  Results show a REACTIVE NST.    Hospital Course: The patient was admitted to Labor and Delivery Triage for observation. She was monitored and fetal heart tones were auscultated over a ten to 15 minute period for reassurance. She was provided oral hydration and a snack  Discharge Condition: good  Disposition:  Home Diet: Regular diet  Discharge Activity: Activity as tolerated   Allergies as of 01/18/2020   No Known Allergies           Total time spent taking care of this patient: 25 minutes  Signed: 01/20/2020, CNM  01/18/2020, 9:08 PM

## 2020-01-18 NOTE — OB Triage Note (Signed)
Pt to be d/c home and to self care. Pt given teaching on when to seek medical attention (LOF, VB and decreased FM, etc..). Pt verbalized understanding of d/c instructions.   ?

## 2020-02-01 ENCOUNTER — Other Ambulatory Visit: Payer: Self-pay

## 2020-02-01 ENCOUNTER — Ambulatory Visit (INDEPENDENT_AMBULATORY_CARE_PROVIDER_SITE_OTHER): Payer: Medicaid Other | Admitting: Obstetrics and Gynecology

## 2020-02-01 ENCOUNTER — Other Ambulatory Visit: Payer: Medicaid Other

## 2020-02-01 VITALS — BP 120/80 | Wt 143.0 lb

## 2020-02-01 DIAGNOSIS — Z3480 Encounter for supervision of other normal pregnancy, unspecified trimester: Secondary | ICD-10-CM

## 2020-02-01 DIAGNOSIS — O26899 Other specified pregnancy related conditions, unspecified trimester: Secondary | ICD-10-CM | POA: Diagnosis not present

## 2020-02-01 DIAGNOSIS — Z3A27 27 weeks gestation of pregnancy: Secondary | ICD-10-CM

## 2020-02-01 DIAGNOSIS — Z6791 Unspecified blood type, Rh negative: Secondary | ICD-10-CM

## 2020-02-01 DIAGNOSIS — Z131 Encounter for screening for diabetes mellitus: Secondary | ICD-10-CM

## 2020-02-01 LAB — POCT URINALYSIS DIPSTICK OB
Glucose, UA: NEGATIVE
POC,PROTEIN,UA: NEGATIVE

## 2020-02-01 MED ORDER — RHO D IMMUNE GLOBULIN 1500 UNIT/2ML IJ SOSY
300.0000 ug | PREFILLED_SYRINGE | Freq: Once | INTRAMUSCULAR | Status: AC
  Administered 2020-02-01: 300 ug via INTRAMUSCULAR

## 2020-02-01 NOTE — Addendum Note (Signed)
Addended by: Cornelius Moras D on: 02/01/2020 10:52 AM   Modules accepted: Orders

## 2020-02-01 NOTE — Progress Notes (Signed)
    Routine Prenatal Care Visit  Subjective  Susan Price is a 19 y.o. G1P0 at [redacted]w[redacted]d being seen today for ongoing prenatal care.  She is currently monitored for the following issues for this low-risk pregnancy and has Polyarthralgia; Rheumatoid factor positive; Supervision of other normal pregnancy, antepartum; Vaginal bleeding in pregnancy, first trimester; Rh negative state in antepartum period; and Abdominal pain affecting pregnancy on their problem list.  ----------------------------------------------------------------------------------- Patient reports no complaints.   Contractions: Not present. Vag. Bleeding: None.  Movement: Present. Denies leaking of fluid.  ----------------------------------------------------------------------------------- The following portions of the patient's history were reviewed and updated as appropriate: allergies, current medications, past family history, past medical history, past social history, past surgical history and problem list. Problem list updated.   Objective  Blood pressure 120/80, weight 143 lb (64.9 kg), last menstrual period 07/24/2019. Pregravid weight 122 lb (55.3 kg) Total Weight Gain 21 lb (9.526 kg) Urinalysis:      Fetal Status: Fetal Heart Rate (bpm): 145 Fundal Height: 26 cm Movement: Present     General:  Alert, oriented and cooperative. Patient is in no acute distress.  Skin: Skin is warm and dry. No rash noted.   Cardiovascular: Normal heart rate noted  Respiratory: Normal respiratory effort, no problems with respiration noted  Abdomen: Soft, gravid, appropriate for gestational age. Pain/Pressure: Absent     Pelvic:  Cervical exam deferred        Extremities: Normal range of motion.     ental Status: Normal mood and affect. Normal behavior. Normal judgment and thought content.     Assessment   19 y.o. G1P0 at [redacted]w[redacted]d by  04/29/2020, by Last Menstrual Period presenting for routine prenatal visit  Plan   Pregnancy #1  Problems (from 08/29/19 to present)    Problem Noted Resolved   Encounter for supervision of normal first pregnancy in first trimester 09/15/2019 by Farrel Conners, CNM No   Overview Addendum 12/07/2019 11:29 AM by Nadara Mustard, MD     Nursing Staff Provider  Office Location  [facility] Dating   LMP = 7wk [place]  Language  English Anatomy US  WSOB nml  Flu Vaccine  Declines Genetic Screen  NIPS:  nml XY  TDaP vaccine    Hgb A1C or  GTT Third trimester :   Rhogam  08/29/2019   LAB RESULTS   Feeding Plan  Blood Type O/Negative/-- (06/30 1030)   Contraception  Antibody Positive, See Final Results (06/30 1030)  Circumcision  Rubella 12.70 (06/30 1030)immune  Pediatrician   RPR Non Reactive (06/30 1030) NR  Support Person BF HBsAg Negative (06/30 1030) negative  Prenatal Classes  HIV Non Reactive (06/30 1030)negative    Varicella immune  BTL Consent No GBS  (For PCN allergy, check sensitivities)        VBAC Consent N/A Pap   Under 21- postpartum [ ]      Hgb Electro      CF      SMA               Previous Version       Gestational age appropriate obstetric precautions including but not limited to vaginal bleeding, contractions, leaking of fluid and fetal movement were reviewed in detail with the patient.    28 week labs and rhogam today  Return in about 2 weeks (around 02/15/2020) for ROB.  13/01/2020, MD, Vena Austria [facility], [facility] 02/01/2020, 10:51 AM

## 2020-02-01 NOTE — Addendum Note (Signed)
Addended by: Cornelius Moras D on: 02/01/2020 11:57 AM   Modules accepted: Orders

## 2020-02-02 LAB — 28 WEEKS RH-PANEL
Antibody Screen: NEGATIVE
Basophils Absolute: 0 10*3/uL (ref 0.0–0.2)
Basos: 0 %
EOS (ABSOLUTE): 0.1 10*3/uL (ref 0.0–0.4)
Eos: 1 %
Gestational Diabetes Screen: 125 mg/dL (ref 65–139)
HIV Screen 4th Generation wRfx: NONREACTIVE
Hematocrit: 39.1 % (ref 34.0–46.6)
Hemoglobin: 12.7 g/dL (ref 11.1–15.9)
Immature Grans (Abs): 0.1 10*3/uL (ref 0.0–0.1)
Immature Granulocytes: 1 %
Lymphocytes Absolute: 1.4 10*3/uL (ref 0.7–3.1)
Lymphs: 16 %
MCH: 30 pg (ref 26.6–33.0)
MCHC: 32.5 g/dL (ref 31.5–35.7)
MCV: 92 fL (ref 79–97)
Monocytes Absolute: 0.6 10*3/uL (ref 0.1–0.9)
Monocytes: 7 %
Neutrophils Absolute: 6.6 10*3/uL (ref 1.4–7.0)
Neutrophils: 75 %
Platelets: 213 10*3/uL (ref 150–450)
RBC: 4.23 x10E6/uL (ref 3.77–5.28)
RDW: 12.2 % (ref 11.7–15.4)
RPR Ser Ql: NONREACTIVE
WBC: 8.7 10*3/uL (ref 3.4–10.8)

## 2020-02-03 ENCOUNTER — Telehealth: Payer: Self-pay

## 2020-02-03 NOTE — Telephone Encounter (Signed)
Notified per JEG she could try aveeno bath, hydrocortisone cream to itchy areas, PO benadryl for a day or two if she's having an allergic reaction and if that doesn't help then a visit.

## 2020-02-03 NOTE — Telephone Encounter (Signed)
Spoke w/patient. She forgot to mention it on Wed at her apt. She does not have a rash, just very itchy all over. Enough that it keeps her up/unable to sleep. She has not tried anything to relieve it. She's hasn't changed detergent's soaps etc.

## 2020-02-03 NOTE — Telephone Encounter (Signed)
Patient reports she has been having extreme itching x~1 wk. Has looked up on line. Inquiring if needs to be seen/have labs drawn.

## 2020-02-16 ENCOUNTER — Other Ambulatory Visit: Payer: Self-pay

## 2020-02-16 ENCOUNTER — Encounter: Payer: Self-pay | Admitting: Advanced Practice Midwife

## 2020-02-16 ENCOUNTER — Ambulatory Visit (INDEPENDENT_AMBULATORY_CARE_PROVIDER_SITE_OTHER): Payer: Medicaid Other | Admitting: Advanced Practice Midwife

## 2020-02-16 VITALS — BP 112/68 | Ht 64.0 in

## 2020-02-16 DIAGNOSIS — Z3483 Encounter for supervision of other normal pregnancy, third trimester: Secondary | ICD-10-CM

## 2020-02-16 DIAGNOSIS — Z3A29 29 weeks gestation of pregnancy: Secondary | ICD-10-CM

## 2020-02-16 LAB — POCT URINALYSIS DIPSTICK OB
Glucose, UA: NEGATIVE
POC,PROTEIN,UA: NEGATIVE

## 2020-02-16 NOTE — Progress Notes (Signed)
Routine Prenatal Care Visit  Subjective  Susan Price is a 19 y.o. G1P0 at [redacted]w[redacted]d being seen today for ongoing prenatal care.  She is currently monitored for the following issues for this low-risk pregnancy and has Polyarthralgia; Rheumatoid factor positive; Supervision of other normal pregnancy, antepartum; Vaginal bleeding in pregnancy, first trimester; Rh negative state in antepartum period; and Abdominal pain affecting pregnancy on their problem list.  ----------------------------------------------------------------------------------- Patient reports general upper body itching for the past couple weeks- neck, head and upper abdomen. She tried benadryl which didn't help. She denies rash.   Contractions: Not present. Vag. Bleeding: None.  Movement: Present. Leaking Fluid denies.  ----------------------------------------------------------------------------------- The following portions of the patient's history were reviewed and updated as appropriate: allergies, current medications, past family history, past medical history, past social history, past surgical history and problem list. Problem list updated.  Objective  Blood pressure 112/68, height 5\' 4"  (1.626 m), last menstrual period 07/24/2019. Pregravid weight 122 lb (55.3 kg) Total Weight Gain 21 lb (9.526 kg) Urinalysis: Urine Protein Negative  Urine Glucose Negative  Fetal Status: Fetal Heart Rate (bpm): 140 Fundal Height: 29 cm Movement: Present     General:  Alert, oriented and cooperative. Patient is in no acute distress.  Skin: Skin is warm and dry. No rash noted around itchy areas.  Cardiovascular: Normal heart rate noted  Respiratory: Normal respiratory effort, no problems with respiration noted  Abdomen: Soft, gravid, appropriate for gestational age. Pain/Pressure: Absent     Pelvic:  Cervical exam deferred        Extremities: Normal range of motion.  Edema: None  Mental Status: Normal mood and affect. Normal behavior.  Normal judgment and thought content.   Assessment   19 y.o. G1P0 at [redacted]w[redacted]d by  04/29/2020, by Last Menstrual Period presenting for routine prenatal visit  Plan   Pregnancy #1 Problems (from 08/29/19 to present)    Problem Noted Resolved   Supervision of other normal pregnancy, antepartum 09/15/2019 by 11/15/2019, CNM No   Overview Addendum 12/07/2019 11:29 AM by 02/06/2020, MD     Nursing Staff Provider  Office Location  [facility] Dating   LMP = 7wk Nadara Mustard  Language  English Anatomy US  WSOB nml  Flu Vaccine  Declines Genetic Screen  NIPS:  nml XY  TDaP vaccine    Hgb A1C or  GTT Third trimester :   Rhogam  08/29/2019   LAB RESULTS   Feeding Plan  Blood Type O/Negative/-- (06/30 1030)   Contraception  Antibody Positive, See Final Results (06/30 1030)  Circumcision  Rubella 12.70 (06/30 1030)immune  Pediatrician   RPR Non Reactive (06/30 1030) NR  Support Person BF HBsAg Negative (06/30 1030) negative  Prenatal Classes  HIV Non Reactive (06/30 1030)negative    Varicella immune  BTL Consent No GBS  (For PCN allergy, check sensitivities)        VBAC Consent N/A Pap   Under 21- postpartum [ ]      Hgb Electro      CF      SMA               Previous Version       Preterm labor symptoms and general obstetric precautions including but not limited to vaginal bleeding, contractions, leaking of fluid and fetal movement were reviewed in detail with the patient. Itching: try aveeno products and anti itch lotion, keep skin moisturized  Return in about 2 weeks (around 03/01/2020) for rob.  ,  CNM 02/16/2020 5:10 PM

## 2020-02-28 ENCOUNTER — Ambulatory Visit (INDEPENDENT_AMBULATORY_CARE_PROVIDER_SITE_OTHER): Payer: Medicaid Other | Admitting: Obstetrics and Gynecology

## 2020-02-28 ENCOUNTER — Other Ambulatory Visit: Payer: Self-pay

## 2020-02-28 ENCOUNTER — Encounter: Payer: Self-pay | Admitting: Obstetrics and Gynecology

## 2020-02-28 ENCOUNTER — Encounter: Payer: Medicaid Other | Admitting: Obstetrics

## 2020-02-28 VITALS — BP 110/68 | Wt 153.0 lb

## 2020-02-28 DIAGNOSIS — Z3483 Encounter for supervision of other normal pregnancy, third trimester: Secondary | ICD-10-CM

## 2020-02-28 DIAGNOSIS — O26843 Uterine size-date discrepancy, third trimester: Secondary | ICD-10-CM

## 2020-02-28 DIAGNOSIS — Z23 Encounter for immunization: Secondary | ICD-10-CM

## 2020-02-28 DIAGNOSIS — Z3480 Encounter for supervision of other normal pregnancy, unspecified trimester: Secondary | ICD-10-CM

## 2020-02-28 DIAGNOSIS — Z3A31 31 weeks gestation of pregnancy: Secondary | ICD-10-CM

## 2020-02-28 LAB — POCT URINALYSIS DIPSTICK OB
Glucose, UA: NEGATIVE
POC,PROTEIN,UA: NEGATIVE

## 2020-02-28 NOTE — Progress Notes (Signed)
ROB - No concerns RM 1 

## 2020-02-28 NOTE — Progress Notes (Signed)
Routine Prenatal Care Visit  Subjective  Susan Price is a 19 y.o. G1P0 at [redacted]w[redacted]d being seen today for ongoing prenatal care.  She is currently monitored for the following issues for this low-risk pregnancy and has Polyarthralgia; Rheumatoid factor positive; Supervision of other normal pregnancy, antepartum; Vaginal bleeding in pregnancy, first trimester; Rh negative state in antepartum period; and Abdominal pain affecting pregnancy on their problem list.  ----------------------------------------------------------------------------------- Patient reports no complaints.   Contractions: Not present. Vag. Bleeding: None.  Movement: Present. Denies leaking of fluid.  ----------------------------------------------------------------------------------- The following portions of the patient's history were reviewed and updated as appropriate: allergies, current medications, past family history, past medical history, past social history, past surgical history and problem list. Problem list updated.   Objective  Blood pressure 110/68, weight 153 lb (69.4 kg), last menstrual period 07/24/2019. Pregravid weight 122 lb (55.3 kg) Total Weight Gain 31 lb (14.1 kg) Urinalysis:      Fetal Status: Fetal Heart Rate (bpm): 123 Fundal Height: 30 cm Movement: Present     General:  Alert, oriented and cooperative. Patient is in no acute distress.  Skin: Skin is warm and dry. No rash noted.   Cardiovascular: Normal heart rate noted  Respiratory: Normal respiratory effort, no problems with respiration noted  Abdomen: Soft, gravid, appropriate for gestational age. Pain/Pressure: Absent     Pelvic:  Cervical exam deferred        Extremities: Normal range of motion.  Edema: None  Mental Status: Normal mood and affect. Normal behavior. Normal judgment and thought content.     Assessment   19 y.o. G1P0 at [redacted]w[redacted]d by  04/29/2020, by Last Menstrual Period presenting for routine prenatal visit  Plan   Pregnancy  #1 Problems (from 08/29/19 to present)    Problem Noted Resolved   Supervision of other normal pregnancy, antepartum 09/15/2019 by Farrel Conners, CNM No   Overview Addendum 02/28/2020  4:36 PM by Natale Milch, MD     Nursing Staff Provider  Office Location  [facility] Dating   LMP = 7wk [place]  Language  English Anatomy US  WSOB nml  Flu Vaccine  Declines Genetic Screen  NIPS:  nml XY  TDaP vaccine    Hgb A1C or  GTT Third trimester : 125  Rhogam  08/29/2019 02/01/2020   LAB RESULTS   Feeding Plan  Blood Type O/Negative/-- (06/30 1030)   Contraception  Antibody Positive, See Final Results (06/30 1030)  Circumcision  Rubella 12.70 (06/30 1030)immune  Pediatrician   RPR Non Reactive (06/30 1030) NR  Support Person BF HBsAg Negative (06/30 1030) negative  Prenatal Classes  HIV Non Reactive (06/30 1030)negative    Varicella immune  BTL Consent No GBS  (For PCN allergy, check sensitivities)        VBAC Consent N/A Pap   Under 21- postpartum [ ]      Hgb Electro      CF      SMA               Previous Version      TDAP today Encouraged covid vaccination Growth next visit Discussed prenatal classes  Gestational age appropriate obstetric precautions including but not limited to vaginal bleeding, contractions, leaking of fluid and fetal movement were reviewed in detail with the patient.    Return in about 2 weeks (around 03/13/2020) for ROB in person and growth 14/10/2019.  [place] MD [facility], [facility] 02/28/2020, 4:37 PM

## 2020-02-28 NOTE — Patient Instructions (Signed)
Third Trimester of Pregnancy The third trimester is from week 28 through week 40 (months 7 through 9). The third trimester is a time when the unborn baby (fetus) is growing rapidly. At the end of the ninth month, the fetus is about 20 inches in length and weighs 6-10 pounds. Body changes during your third trimester Your body will continue to go through many changes during pregnancy. The changes vary from woman to woman. During the third trimester:  Your weight will continue to increase. You can expect to gain 25-35 pounds (11-16 kg) by the end of the pregnancy.  You may begin to get stretch marks on your hips, abdomen, and breasts.  You may urinate more often because the fetus is moving lower into your pelvis and pressing on your bladder.  You may develop or continue to have heartburn. This is caused by increased hormones that slow down muscles in the digestive tract.  You may develop or continue to have constipation because increased hormones slow digestion and cause the muscles that push waste through your intestines to relax.  You may develop hemorrhoids. These are swollen veins (varicose veins) in the rectum that can itch or be painful.  You may develop swollen, bulging veins (varicose veins) in your legs.  You may have increased body aches in the pelvis, back, or thighs. This is due to weight gain and increased hormones that are relaxing your joints.  You may have changes in your hair. These can include thickening of your hair, rapid growth, and changes in texture. Some women also have hair loss during or after pregnancy, or hair that feels dry or thin. Your hair will most likely return to normal after your baby is born.  Your breasts will continue to grow and they will continue to become tender. A yellow fluid (colostrum) may leak from your breasts. This is the first milk you are producing for your baby.  Your belly button may stick out.  You may notice more swelling in your hands,  face, or ankles.  You may have increased tingling or numbness in your hands, arms, and legs. The skin on your belly may also feel numb.  You may feel short of breath because of your expanding uterus.  You may have more problems sleeping. This can be caused by the size of your belly, increased need to urinate, and an increase in your body's metabolism.  You may notice the fetus "dropping," or moving lower in your abdomen (lightening).  You may have increased vaginal discharge.  You may notice your joints feel loose and you may have pain around your pelvic bone. What to expect at prenatal visits You will have prenatal exams every 2 weeks until week 36. Then you will have weekly prenatal exams. During a routine prenatal visit:  You will be weighed to make sure you and the baby are growing normally.  Your blood pressure will be taken.  Your abdomen will be measured to track your baby's growth.  The fetal heartbeat will be listened to.  Any test results from the previous visit will be discussed.  You may have a cervical check near your due date to see if your cervix has softened or thinned (effaced).  You will be tested for Group B streptococcus. This happens between 35 and 37 weeks. Your health care provider may ask you:  What your birth plan is.  How you are feeling.  If you are feeling the baby move.  If you have had any abnormal   symptoms, such as leaking fluid, bleeding, severe headaches, or abdominal cramping.  If you are using any tobacco products, including cigarettes, chewing tobacco, and electronic cigarettes.  If you have any questions. Other tests or screenings that may be performed during your third trimester include:  Blood tests that check for low iron levels (anemia).  Fetal testing to check the health, activity level, and growth of the fetus. Testing is done if you have certain medical conditions or if there are problems during the pregnancy.  Nonstress test  (NST). This test checks the health of your baby to make sure there are no signs of problems, such as the baby not getting enough oxygen. During this test, a belt is placed around your belly. The baby is made to move, and its heart rate is monitored during movement. What is false labor? False labor is a condition in which you feel small, irregular tightenings of the muscles in the womb (contractions) that usually go away with rest, changing position, or drinking water. These are called Braxton Hicks contractions. Contractions may last for hours, days, or even weeks before true labor sets in. If contractions come at regular intervals, become more frequent, increase in intensity, or become painful, you should see your health care provider. What are the signs of labor?  Abdominal cramps.  Regular contractions that start at 10 minutes apart and become stronger and more frequent with time.  Contractions that start on the top of the uterus and spread down to the lower abdomen and back.  Increased pelvic pressure and dull back pain.  A watery or bloody mucus discharge that comes from the vagina.  Leaking of amniotic fluid. This is also known as your "water breaking." It could be a slow trickle or a gush. Let your health care provider know if it has a color or strange odor. If you have any of these signs, call your health care provider right away, even if it is before your due date. Follow these instructions at home: Medicines  Follow your health care provider's instructions regarding medicine use. Specific medicines may be either safe or unsafe to take during pregnancy.  Take a prenatal vitamin that contains at least 600 micrograms (mcg) of folic acid.  If you develop constipation, try taking a stool softener if your health care provider approves. Eating and drinking   Eat a balanced diet that includes fresh fruits and vegetables, whole grains, good sources of protein such as meat, eggs, or tofu,  and low-fat dairy. Your health care provider will help you determine the amount of weight gain that is right for you.  Avoid raw meat and uncooked cheese. These carry germs that can cause birth defects in the baby.  If you have low calcium intake from food, talk to your health care provider about whether you should take a daily calcium supplement.  Eat four or five small meals rather than three large meals a day.  Limit foods that are high in fat and processed sugars, such as fried and sweet foods.  To prevent constipation: ? Drink enough fluid to keep your urine clear or pale yellow. ? Eat foods that are high in fiber, such as fresh fruits and vegetables, whole grains, and beans. Activity  Exercise only as directed by your health care provider. Most women can continue their usual exercise routine during pregnancy. Try to exercise for 30 minutes at least 5 days a week. Stop exercising if you experience uterine contractions.  Avoid heavy lifting.  Do   not exercise in extreme heat or humidity, or at high altitudes.  Wear low-heel, comfortable shoes.  Practice good posture.  You may continue to have sex unless your health care provider tells you otherwise. Relieving pain and discomfort  Take frequent breaks and rest with your legs elevated if you have leg cramps or low back pain.  Take warm sitz baths to soothe any pain or discomfort caused by hemorrhoids. Use hemorrhoid cream if your health care provider approves.  Wear a good support bra to prevent discomfort from breast tenderness.  If you develop varicose veins: ? Wear support pantyhose or compression stockings as told by your healthcare provider. ? Elevate your feet for 15 minutes, 3-4 times a day. Prenatal care  Write down your questions. Take them to your prenatal visits.  Keep all your prenatal visits as told by your health care provider. This is important. Safety  Wear your seat belt at all times when driving.  Make  a list of emergency phone numbers, including numbers for family, friends, the hospital, and police and fire departments. General instructions  Avoid cat litter boxes and soil used by cats. These carry germs that can cause birth defects in the baby. If you have a cat, ask someone to clean the litter box for you.  Do not travel far distances unless it is absolutely necessary and only with the approval of your health care provider.  Do not use hot tubs, steam rooms, or saunas.  Do not drink alcohol.  Do not use any products that contain nicotine or tobacco, such as cigarettes and e-cigarettes. If you need help quitting, ask your health care provider.  Do not use any medicinal herbs or unprescribed drugs. These chemicals affect the formation and growth of the baby.  Do not douche or use tampons or scented sanitary pads.  Do not cross your legs for long periods of time.  To prepare for the arrival of your baby: ? Take prenatal classes to understand, practice, and ask questions about labor and delivery. ? Make a trial run to the hospital. ? Visit the hospital and tour the maternity area. ? Arrange for maternity or paternity leave through employers. ? Arrange for family and friends to take care of pets while you are in the hospital. ? Purchase a rear-facing car seat and make sure you know how to install it in your car. ? Pack your hospital bag. ? Prepare the baby's nursery. Make sure to remove all pillows and stuffed animals from the baby's crib to prevent suffocation.  Visit your dentist if you have not gone during your pregnancy. Use a soft toothbrush to brush your teeth and be gentle when you floss. Contact a health care provider if:  You are unsure if you are in labor or if your water has broken.  You become dizzy.  You have mild pelvic cramps, pelvic pressure, or nagging pain in your abdominal area.  You have lower back pain.  You have persistent nausea, vomiting, or  diarrhea.  You have an unusual or bad smelling vaginal discharge.  You have pain when you urinate. Get help right away if:  Your water breaks before 37 weeks.  You have regular contractions less than 5 minutes apart before 37 weeks.  You have a fever.  You are leaking fluid from your vagina.  You have spotting or bleeding from your vagina.  You have severe abdominal pain or cramping.  You have rapid weight loss or weight gain.  You have   shortness of breath with chest pain.  You notice sudden or extreme swelling of your face, hands, ankles, feet, or legs.  Your baby makes fewer than 10 movements in 2 hours.  You have severe headaches that do not go away when you take medicine.  You have vision changes. Summary  The third trimester is from week 28 through week 40, months 7 through 9. The third trimester is a time when the unborn baby (fetus) is growing rapidly.  During the third trimester, your discomfort may increase as you and your baby continue to gain weight. You may have abdominal, leg, and back pain, sleeping problems, and an increased need to urinate.  During the third trimester your breasts will keep growing and they will continue to become tender. A yellow fluid (colostrum) may leak from your breasts. This is the first milk you are producing for your baby.  False labor is a condition in which you feel small, irregular tightenings of the muscles in the womb (contractions) that eventually go away. These are called Braxton Hicks contractions. Contractions may last for hours, days, or even weeks before true labor sets in.  Signs of labor can include: abdominal cramps; regular contractions that start at 10 minutes apart and become stronger and more frequent with time; watery or bloody mucus discharge that comes from the vagina; increased pelvic pressure and dull back pain; and leaking of amniotic fluid. This information is not intended to replace advice given to you by your  health care provider. Make sure you discuss any questions you have with your health care provider. Document Revised: 07/15/2018 Document Reviewed: 04/29/2016 Elsevier Patient Education  2020 [employer]  

## 2020-03-14 ENCOUNTER — Ambulatory Visit (INDEPENDENT_AMBULATORY_CARE_PROVIDER_SITE_OTHER): Payer: Medicaid Other

## 2020-03-14 ENCOUNTER — Other Ambulatory Visit: Payer: Self-pay

## 2020-03-14 DIAGNOSIS — Z3A34 34 weeks gestation of pregnancy: Secondary | ICD-10-CM

## 2020-03-14 DIAGNOSIS — Z3483 Encounter for supervision of other normal pregnancy, third trimester: Secondary | ICD-10-CM | POA: Diagnosis not present

## 2020-03-15 ENCOUNTER — Encounter: Payer: Self-pay | Admitting: Advanced Practice Midwife

## 2020-03-15 ENCOUNTER — Ambulatory Visit (INDEPENDENT_AMBULATORY_CARE_PROVIDER_SITE_OTHER): Payer: Medicaid Other | Admitting: Advanced Practice Midwife

## 2020-03-15 VITALS — BP 120/70 | Wt 156.0 lb

## 2020-03-15 DIAGNOSIS — Z3A33 33 weeks gestation of pregnancy: Secondary | ICD-10-CM

## 2020-03-15 DIAGNOSIS — Z3483 Encounter for supervision of other normal pregnancy, third trimester: Secondary | ICD-10-CM

## 2020-03-15 LAB — POCT URINALYSIS DIPSTICK OB
Glucose, UA: NEGATIVE
POC,PROTEIN,UA: NEGATIVE

## 2020-03-15 NOTE — Progress Notes (Signed)
  Routine Prenatal Care Visit  Subjective  Susan Price is a 19 y.o. G1P0 at [redacted]w[redacted]d being seen today for ongoing prenatal care.  She is currently monitored for the following issues for this low-risk pregnancy and has Polyarthralgia; Rheumatoid factor positive; Supervision of other normal pregnancy, antepartum; Vaginal bleeding in pregnancy, first trimester; Rh negative state in antepartum period; and Abdominal pain affecting pregnancy on their problem list.  ----------------------------------------------------------------------------------- Patient reports no complaints.  We discussed results of yesterdays growth scan. Contractions: Not present. Vag. Bleeding: None.  Movement: Present. Leaking Fluid denies.  ----------------------------------------------------------------------------------- The following portions of the patient's history were reviewed and updated as appropriate: allergies, current medications, past family history, past medical history, past social history, past surgical history and problem list. Problem list updated.  Objective  Blood pressure 120/70, weight 156 lb (70.8 kg), last menstrual period 07/24/2019. Pregravid weight 122 lb (55.3 kg) Total Weight Gain 34 lb (15.4 kg) Urinalysis: Urine Protein Negative  Urine Glucose Negative  Fetal Status: Fetal Heart Rate (bpm): 132 Fundal Height: 33 cm Movement: Present      Growth scan: 56.5%, AC 85.2%, 5#3oz, AFI 17.1 cm  General:  Alert, oriented and cooperative. Patient is in no acute distress.  Skin: Skin is warm and dry. No rash noted.   Cardiovascular: Normal heart rate noted  Respiratory: Normal respiratory effort, no problems with respiration noted  Abdomen: Soft, gravid, appropriate for gestational age. Pain/Pressure: Absent     Pelvic:  Cervical exam deferred        Extremities: Normal range of motion.  Edema: None  Mental Status: Normal mood and affect. Normal behavior. Normal judgment and thought content.    Assessment   19 y.o. G1P0 at [redacted]w[redacted]d by  04/29/2020, by Last Menstrual Period presenting for routine prenatal visit  Plan   Pregnancy #1 Problems (from 08/29/19 to present)    Problem Noted Resolved   Supervision of other normal pregnancy, antepartum 09/15/2019 by Farrel Conners, CNM No   Overview Addendum 02/28/2020  4:45 PM by Natale Milch, MD     Nursing Staff Provider  Office Location  [facility] Dating   LMP = 7wk [place]  Language  English Anatomy US  [facility] nml  Flu Vaccine  Declines Genetic Screen  NIPS:  nml XY  TDaP vaccine   02/28/2020 Hgb A1C or  GTT Third trimester : 125  Rhogam  08/29/2019 02/01/2020   LAB RESULTS   Feeding Plan bottle Blood Type O/Negative/-- (06/30 1030)   Contraception OCP Antibody Positive, See Final Results (06/30 1030)  Circumcision  Rubella 12.70 (06/30 1030)immune  Pediatrician   RPR Non Reactive (06/30 1030) NR  Support Person Jashawn HBsAg Negative (06/30 1030) negative  Prenatal Classes  HIV Non Reactive (06/30 1030)negative    Varicella immune  BTL Consent No GBS  (For PCN allergy, check sensitivities)        VBAC Consent N/A Pap   Under 21- postpartum [ ]      Hgb Electro      CF   Covid Vaccination encouraged  SMA               Previous Version       Preterm labor symptoms and general obstetric precautions including but not limited to vaginal bleeding, contractions, leaking of fluid and fetal movement were reviewed in detail with the patient.   Return in about 2 weeks (around 03/29/2020) for rob.  03/31/2020, CNM 03/15/2020 3:23 PM

## 2020-03-15 NOTE — Progress Notes (Signed)
ROB- no concerns 

## 2020-03-19 ENCOUNTER — Other Ambulatory Visit: Payer: Self-pay

## 2020-03-19 ENCOUNTER — Observation Stay
Admission: EM | Admit: 2020-03-19 | Discharge: 2020-03-19 | Disposition: A | Discharge status: Home / Self Care | Payer: Medicaid Other | Attending: Obstetrics & Gynecology | Admitting: Obstetrics & Gynecology

## 2020-03-19 ENCOUNTER — Encounter: Payer: Self-pay | Admitting: Obstetrics & Gynecology

## 2020-03-19 DIAGNOSIS — O368131 Decreased fetal movements, third trimester, fetus 1: Principal | ICD-10-CM | POA: Insufficient documentation

## 2020-03-19 DIAGNOSIS — O368130 Decreased fetal movements, third trimester, not applicable or unspecified: Secondary | ICD-10-CM | POA: Diagnosis not present

## 2020-03-19 DIAGNOSIS — Z87891 Personal history of nicotine dependence: Secondary | ICD-10-CM | POA: Insufficient documentation

## 2020-03-19 DIAGNOSIS — O368190 Decreased fetal movements, unspecified trimester, not applicable or unspecified: Secondary | ICD-10-CM

## 2020-03-19 DIAGNOSIS — Z3A34 34 weeks gestation of pregnancy: Secondary | ICD-10-CM

## 2020-03-19 DIAGNOSIS — Z3480 Encounter for supervision of other normal pregnancy, unspecified trimester: Secondary | ICD-10-CM

## 2020-03-19 NOTE — Progress Notes (Signed)
Pt discharged home per M.Eunice Blase, CNM order. Pt stable and ambulatory. Pt stated, 'I came in to make sure he was fine and I heard his heart beat and I feel much better, he is even kicking the monitors." An After Visit Summary was printed and given to the patient. Discharge education completed with patient/family including follow up instructions, medication list, d/c activities limitations if indicated, with other d/c instructions as indicated by CNM . Pt received labor and bleeding precautions. Patient able to verbalize understanding, all questions fully answered. Patient instructed to return to ED, call 911, or call MD for any changes in condition. Pt discharged home via personal vehicle with support person.

## 2020-03-19 NOTE — Discharge Summary (Addendum)
Please see Final Progress Note. Mirna Mires, CNM  03/19/2020 6:37 PM

## 2020-03-19 NOTE — Final Progress Note (Signed)
Final Progress Note  Patient ID: Susan Price MRN: [id number] DOB/AGE: 2001-02-24 19 y.o.  Admit date: 03/19/2020 Admitting provider: Mirna Mires, CNM Discharge date: 03/19/2020   Admission Diagnoses: [redacted] weeks gestation Decreased fetal movement S/P maternal fall  Discharge Diagnoses:  Active Problems:   [redacted] weeks gestation of pregnancy Reassuring fetal heart tones  History of Present Illness: The patient is a 19 y.o. female G1P0 at [redacted]w[redacted]d who presents for concerns that her baby has not been moving today. She reports having fallen this morning at 0930 from the last step of her front porch .Susan Price landed on her side, not on her abdomen.  She denies any contractions, LOF, or cramping, but did notice this afternoon that her baby was not moving, and she presented to Labor and Delivery for evaluation. She denies any spotting or vaginal bleeding.  Past Medical History:  Diagnosis Date  . Depression     Past Surgical History:  Procedure Laterality Date  . TONSILLECTOMY AND ADENOIDECTOMY      No current facility-administered medications on file prior to encounter.   Current Outpatient Medications on File Prior to Encounter  Medication Sig Dispense Refill  . Prenatal Vit-Fe Fumarate-FA (MULTIVITAMIN-PRENATAL) 27-0.8 MG TABS tablet Take 1 tablet by mouth daily at 12 noon.      No Known Allergies  Social History   Socioeconomic History  . Marital status: Single    Spouse name: Not on file  . Number of children: Not on file  . Years of education: Not on file  . Highest education level: Not on file  Occupational History  . Not on file  Tobacco Use  . Smoking status: Former Smoker    Types: Cigarettes  . Smokeless tobacco: Never Used  Vaping Use  . Vaping Use: Former  Substance and Sexual Activity  . Alcohol use: No  . Drug use: Not Currently    Types: Marijuana    Comment: a couple times during the beggingin of pregnancy]  . Sexual activity: Yes    Partners: Male     Birth control/protection: None  Other Topics Concern  . Not on file  Social History Narrative  . Not on file   Social Determinants of Health   Financial Resource Strain: Not on file  Food Insecurity: Not on file  Transportation Needs: Not on file  Physical Activity: Not on file  Stress: Not on file  Social Connections: Not on file  Intimate Partner Violence: Not on file    Family History  Problem Relation Age of Onset  . Anxiety disorder Mother      Review of Systems  Constitutional: Negative.   HENT: Negative.   Eyes: Negative.   Cardiovascular: Negative.   Genitourinary: Negative.   Musculoskeletal: Negative.   Skin: Negative.   Neurological: Negative.   Endo/Heme/Allergies: Negative.   Psychiatric/Behavioral: Negative.      Physical Exam: BP 114/68 (BP Location: Left Arm)   Pulse 84   Temp 98.4 F (36.9 C) (Oral)   Resp 18   LMP 07/24/2019 (Exact Date)   Physical Exam Constitutional:      Appearance: Normal appearance. She is normal weight.  Genitourinary:     Genitourinary Comments: Deferred.  HENT:     Head: Normocephalic and atraumatic.     Nose: Nose normal.  Eyes:     Extraocular Movements: Extraocular movements intact.     Pupils: Pupils are equal, round, and reactive to light.  Cardiovascular:     Rate and Rhythm: Regular rhythm.  Pulses: Normal pulses.     Heart sounds: Normal heart sounds.  Pulmonary:     Effort: Pulmonary effort is normal.     Breath sounds: Normal breath sounds.  Abdominal:     General: Bowel sounds are normal.     Palpations: Abdomen is soft.     Comments: Gravid abdomen  Musculoskeletal:        General: Normal range of motion.     Cervical back: Normal range of motion and neck supple.  Neurological:     General: No focal deficit present.     Mental Status: She is alert and oriented to person, place, and time.  Skin:    General: Skin is warm and dry.  Psychiatric:        Mood and Affect: Mood normal.         Behavior: Behavior normal.     Consults: None  Significant Findings/ Diagnostic Studies: none  Procedures: NST NST Baseline FHR: 120 beats/min Variability: moderate Accelerations: present Decelerations: absent Tocometry: no contractions noted  Or palpated  Interpretation:  INDICATIONS: decreased fetal movement RESULTS:  A NST procedure was performed with FHR monitoring and a normal baseline established, appropriate time of 20-40 minutes of evaluation, and accels >2 seen w 15x15 characteristics.  Results show a REACTIVE NST.    Hospital Course: The patient was admitted to Labor and Delivery Triage for observation. She was placed on the fetal monitor, and the FHTs were reassuring. After hydration with juice, and being provided a snack, she reported ample fetal movement. She was encouraged to stay for several hours for further monitoring,but insisted on discharge. The EFM strip was reassuring and reactive. Her vital signs were WNL, and she is discharged home with plans for follow up at her next [facility] appointment.  Discharge Condition: good  Disposition:  Discharge disposition: 01-Home or Self Care       Diet: Regular diet  Discharge Activity: Activity as tolerated  Discharge Instructions    Discharge activity:  No Restrictions   Complete by: As directed    Discharge diet:  No restrictions   Complete by: As directed    Fetal Kick Count:  Lie on our left side for one hour after a meal, and count the number of times your baby kicks.  If it is less than 5 times, get up, move around and drink some juice.  Repeat the test 30 minutes later.  If it is still less than 5 kicks in an hour, notify your doctor.   Complete by: As directed    LABOR:  When conractions begin, you should start to time them from the beginning of one contraction to the beginning  of the next.  When contractions are 5 - 10 minutes apart or less and have been regular for at least an hour, you should call your  health care provider.   Complete by: As directed    No sexual activity restrictions   Complete by: As directed    Notify physician for bleeding from the vagina   Complete by: As directed    Notify physician for blurring of vision or spots before the eyes   Complete by: As directed    Notify physician for chills or fever   Complete by: As directed    Notify physician for fainting spells, "black outs" or loss of consciousness   Complete by: As directed    Notify physician for increase in vaginal discharge   Complete by: As directed  Notify physician for leaking of fluid   Complete by: As directed    Notify physician for pain or burning when urinating   Complete by: As directed    Notify physician for pelvic pressure (sudden increase)   Complete by: As directed    Notify physician for severe or continued nausea or vomiting   Complete by: As directed    Notify physician for sudden gushing of fluid from the vagina (with or without continued leaking)   Complete by: As directed    Notify physician for sudden, constant, or occasional abdominal pain   Complete by: As directed    Notify physician if baby moving less than usual   Complete by: As directed      Allergies as of 03/19/2020   No Known Allergies     Medication List    TAKE these medications   multivitamin-prenatal 27-0.8 MG Tabs tablet Take 1 tablet by mouth daily at 12 noon.        Total time spent taking care of this patient: 30 minutes  Signed: Mirna Mires, CNM  03/19/2020, 6:53 PM

## 2020-03-19 NOTE — OB Triage Note (Signed)
Pt Susan Price 19 y.o. presents to the ED after experiencing a fall at home and having decreased fetal movement.  Pt is a G1P0 at [redacted]w[redacted]d . Pt denies signs and symptons consistent with rupture of membranes or active vaginal bleeding and denies ctx. Pt experienced a fall at home at 9am after letting her dogs out to the back yard, pt states she has 3 back porch steps and fell on the last step onto her right hip. Pt did not land on her abdomen and is experiencing no pain but does state it was a "pretty hard fall". Pt reports not feeling baby move since the fall. Pt was unable to get an appointment at [facility] and was encouraged to come to the ED for further evaluation. External FM and TOCO applied to non-tender abdomen and assessing. Initial FHR 135 . Vital signs obtained and within normal limits. Provider notified of pt.

## 2020-03-28 ENCOUNTER — Telehealth: Payer: Self-pay

## 2020-03-28 NOTE — Telephone Encounter (Signed)
Pt calling; has apt tomorrow; got a belly button piercing before she became preg; has taken it out; now it's red, swollen, and inflammed; should her apt be moved up?  [phone]  Pt removed piercing when she found out she was preg; sxs started a couple days ago.  Adv to keep clean, apply bandage or pad, keep appt tomorrow.  Pt aware if starts to run fever of 100.5 to be seen. Pt aware drainage is good.  Pt reassured.

## 2020-03-29 ENCOUNTER — Other Ambulatory Visit: Payer: Self-pay

## 2020-03-29 ENCOUNTER — Encounter: Payer: Self-pay | Admitting: Advanced Practice Midwife

## 2020-03-29 ENCOUNTER — Ambulatory Visit (INDEPENDENT_AMBULATORY_CARE_PROVIDER_SITE_OTHER): Payer: Medicaid Other | Admitting: Advanced Practice Midwife

## 2020-03-29 VITALS — BP 110/70 | Wt 159.0 lb

## 2020-03-29 DIAGNOSIS — Z3A35 35 weeks gestation of pregnancy: Secondary | ICD-10-CM

## 2020-03-29 DIAGNOSIS — Z3480 Encounter for supervision of other normal pregnancy, unspecified trimester: Secondary | ICD-10-CM

## 2020-03-29 LAB — POCT URINALYSIS DIPSTICK OB
Glucose, UA: NEGATIVE
POC,PROTEIN,UA: NEGATIVE

## 2020-03-29 NOTE — Progress Notes (Signed)
Routine Prenatal Care Visit  Subjective  Susan Price is a 19 y.o. G1P0 at [redacted]w[redacted]d being seen today for ongoing prenatal care.  She is currently monitored for the following issues for this low-risk pregnancy and has Polyarthralgia; Rheumatoid factor positive; Supervision of other normal pregnancy, antepartum; Vaginal bleeding in pregnancy, first trimester; Rh negative state in antepartum period; Abdominal pain affecting pregnancy; [redacted] weeks gestation of pregnancy; and Decreased fetal movements affecting management of mother, antepartum on their problem list.  ----------------------------------------------------------------------------------- Patient reports an infection at the site of her belly button piercing. She was scratching around her belly and then the area became infected.   Contractions: Not present. Vag. Bleeding: None.  Movement: Present. Leaking Fluid denies.  ----------------------------------------------------------------------------------- The following portions of the patient's history were reviewed and updated as appropriate: allergies, current medications, past family history, past medical history, past social history, past surgical history and problem list. Problem list updated.  Objective  Blood pressure 110/70, weight 159 lb (72.1 kg), last menstrual period 07/24/2019. Pregravid weight 122 lb (55.3 kg) Total Weight Gain 37 lb (16.8 kg) Urinalysis: Urine Protein Negative  Urine Glucose Negative  Fetal Status: Fetal Heart Rate (bpm): 138 Fundal Height: 35 cm Movement: Present     General:  Alert, oriented and cooperative. Patient is in no acute distress.  Skin: Skin is warm and dry. No rash noted. Small area around belly button piercing is red and mildly inflamed, no warmth or pus  Cardiovascular: Normal heart rate noted  Respiratory: Normal respiratory effort, no problems with respiration noted  Abdomen: Soft, gravid, appropriate for gestational age. Pain/Pressure: Absent      Pelvic:  Cervical exam deferred        Extremities: Normal range of motion.  Edema: None  Mental Status: Normal mood and affect. Normal behavior. Normal judgment and thought content.   Assessment   19 y.o. G1P0 at [redacted]w[redacted]d by  04/29/2020, by Last Menstrual Period presenting for routine prenatal visit  Plan   Pregnancy #1 Problems (from 08/29/19 to present)    Problem Noted Resolved   Supervision of other normal pregnancy, antepartum 09/15/2019 by Farrel Conners, CNM No   Overview Addendum 02/28/2020  4:45 PM by Natale Milch, MD     Nursing Staff Provider  Office Location  [facility] Dating   LMP = 7wk [place]  Language  English Anatomy US  [facility] nml  Flu Vaccine  Declines Genetic Screen  NIPS:  nml XY  TDaP vaccine   02/28/2020 Hgb A1C or  GTT Third trimester : 125  Rhogam  08/29/2019 02/01/2020   LAB RESULTS   Feeding Plan bottle Blood Type O/Negative/-- (06/30 1030)   Contraception OCP Antibody Positive, See Final Results (06/30 1030)  Circumcision  Rubella 12.70 (06/30 1030)immune  Pediatrician   RPR Non Reactive (06/30 1030) NR  Support Person Jashawn HBsAg Negative (06/30 1030) negative  Prenatal Classes  HIV Non Reactive (06/30 1030)negative    Varicella immune  BTL Consent No GBS  (For PCN allergy, check sensitivities)        VBAC Consent N/A Pap   Under 21- postpartum [ ]      Hgb Electro      CF   Covid Vaccination encouraged  SMA               Previous Version    Skin infection: warm compresses to affected area, topical antibiotic ointment, keep area clean and dry, avoid scratching   Preterm labor symptoms and general obstetric precautions including  but not limited to vaginal bleeding, contractions, leaking of fluid and fetal movement were reviewed in detail with the patient. Please refer to After Visit Summary for other counseling recommendations.   Return in about 1 week (around 04/05/2020) for rob.  Tresea Mall, CNM 03/29/2020 2:14 PM

## 2020-03-29 NOTE — Progress Notes (Signed)
ROB- no concerns 

## 2020-04-05 ENCOUNTER — Other Ambulatory Visit (HOSPITAL_COMMUNITY)
Admission: RE | Admit: 2020-04-05 | Discharge: 2020-04-05 | Disposition: A | Discharge status: Home / Self Care | Payer: Medicaid Other | Source: Ambulatory Visit | Attending: Advanced Practice Midwife | Admitting: Advanced Practice Midwife

## 2020-04-05 ENCOUNTER — Encounter: Payer: Self-pay | Admitting: Advanced Practice Midwife

## 2020-04-05 ENCOUNTER — Ambulatory Visit (INDEPENDENT_AMBULATORY_CARE_PROVIDER_SITE_OTHER): Payer: Medicaid Other | Admitting: Advanced Practice Midwife

## 2020-04-05 ENCOUNTER — Other Ambulatory Visit: Payer: Self-pay

## 2020-04-05 VITALS — BP 112/70 | Wt 161.2 lb

## 2020-04-05 DIAGNOSIS — Z3A36 36 weeks gestation of pregnancy: Secondary | ICD-10-CM

## 2020-04-05 DIAGNOSIS — Z113 Encounter for screening for infections with a predominantly sexual mode of transmission: Secondary | ICD-10-CM | POA: Diagnosis present

## 2020-04-05 DIAGNOSIS — Z3685 Encounter for antenatal screening for Streptococcus B: Secondary | ICD-10-CM

## 2020-04-05 DIAGNOSIS — Z3483 Encounter for supervision of other normal pregnancy, third trimester: Secondary | ICD-10-CM

## 2020-04-05 LAB — POCT URINALYSIS DIPSTICK OB
Glucose, UA: NEGATIVE
POC,PROTEIN,UA: NEGATIVE

## 2020-04-05 NOTE — Patient Instructions (Signed)
Pain Relief During Labor and Delivery Many things can cause pain during labor and delivery, including:  Pressure on bones and ligaments due to the baby moving through the pelvis.  Stretching of tissues due to the baby moving through the birth canal.  Muscle tension due to anxiety or nervousness.  The uterus tightening (contracting) and relaxing to help move the baby. There are many ways to deal with the pain of labor and delivery. They include:  Taking prenatal classes. Taking these classes helps you know what to expect during your baby's birth. What you learn will increase your confidence and decrease your anxiety.  Practicing relaxation techniques or doing relaxing activities, such as: ? Focused breathing. ? Meditation. ? Visualization. ? Aroma therapy. ? Listening to your favorite music. ? Hypnosis.  Taking a warm shower or bath (hydrotherapy). This may: ? Provide comfort and relaxation. ? Lessen your perception of pain. ? Decrease the amount of pain medicine needed. ? Decrease the length of labor.  Getting a massage or counterpressure on your back.  Applying warm packs or ice packs.  Changing positions often, moving around, or using a birthing ball.  Getting: ? Pain medicine through an IV or injection into a muscle. ? Pain medicine inserted into your spinal column. ? Injections of sterile water just under the skin on your lower back (intradermal injections). ? Laughing gas (nitrous oxide). Discuss your pain control options with your health care provider during your prenatal visits. Explore the options offered by your hospital or birth center. What kinds of medicine are available? There are two kinds of medicines that can be used to relieve pain during labor and delivery:  Analgesics. These medicines decrease pain without causing you to lose feeling or the ability to move your muscles.  Anesthetics. These medicines block feeling in the body and can decrease your  ability to move freely. Both of these kinds of medicine can cause minor side effects, such as nausea, trouble concentrating, and sleepiness. They can also decrease the baby's heart rate before birth and affect the baby's breathing rate after birth. For this reason, health care providers are careful about when and how much medicine is given. What are specific medicines and procedures that provide pain relief? Local Anesthetics Local anesthetics are used to numb a small area of the body. They may be used along with another kind of anesthetic or used to numb the nerves of the vagina, cervix, and perineum during the second stage of labor. General Anesthetics General anesthetics cause you to lose consciousness so you do not feel pain. They are usually only used for an emergency cesarean delivery. General anesthetics are given through an IV tube and a mask. Pudendal Block A pudendal block is a form of local anesthetic. It may be used to relieve the pain associated with pushing or stretching of the perineum at the time of delivery or to further numb the perineum. A pudendal block is done by injecting numbing medicine through the vaginal wall into a nerve in the pelvis. Epidural Analgesia Epidural analgesia is given through a flexible IV catheter that is inserted into the lower back. Numbing medicine is delivered continuously to the area near your spinal column nerves (epidural space). After having this type of analgesia, you may be able to move your legs but you most likely will not be able to walk. Depending on the amount of medicine given, you may lose all feeling in the lower half of your body, or you may retain some level   of sensation, including the urge to push. Epidural analgesia can be used to provide pain relief for a vaginal birth. Spinal Block A spinal block is similar to epidural analgesia, but the medicine is injected into the spinal fluid instead of the epidural space. A spinal block is only given  once. It starts to relieve pain quickly, but the pain relief lasts only 1-6 hours. Spinal blocks can be used for cesarean deliveries. Combined Spinal-Epidural (CSE) Block A CSE block combines the effects of a spinal block and epidural analgesia. The spinal block works quickly to block all pain. The epidural analgesia provides continuous pain relief, even after the effects of the spinal block have worn off. This information is not intended to replace advice given to you by your health care provider. Make sure you discuss any questions you have with your health care provider. Document Revised: 03/06/2017 Document Reviewed: 08/15/2015 Elsevier Patient Education  2020 [employer] Vaginal Delivery  Vaginal delivery means that you give birth by pushing your baby out of your birth canal (vagina). A team of health care providers will help you before, during, and after vaginal delivery. Birth experiences are unique for every woman and every pregnancy, and birth experiences vary depending on where you choose to give birth. What happens when I arrive at the birth center or hospital? Once you are in labor and have been admitted into the hospital or birth center, your health care provider may:  Review your pregnancy history and any concerns that you have.  Insert an IV into one of your veins. This may be used to give you fluids and medicines.  Check your blood pressure, pulse, temperature, and heart rate (vital signs).  Check whether your bag of water (amniotic sac) has broken (ruptured).  Talk with you about your birth plan and discuss pain control options. Monitoring Your health care provider may monitor your contractions (uterine monitoring) and your baby's heart rate (fetal monitoring). You may need to be monitored:  Often, but not continuously (intermittently).  All the time or for long periods at a time (continuously). Continuous monitoring may be needed if: ? You are taking certain medicines,  such as medicine to relieve pain or make your contractions stronger. ? You have pregnancy or labor complications. Monitoring may be done by:  Placing a special stethoscope or a handheld monitoring device on your abdomen to check your baby's heartbeat and to check for contractions.  Placing monitors on your abdomen (external monitors) to record your baby's heartbeat and the frequency and length of contractions.  Placing monitors inside your uterus through your vagina (internal monitors) to record your baby's heartbeat and the frequency, length, and strength of your contractions. Depending on the type of monitor, it may remain in your uterus or on your baby's head until birth.  Telemetry. This is a type of continuous monitoring that can be done with external or internal monitors. Instead of having to stay in bed, you are able to move around during telemetry. Physical exam Your health care provider may perform frequent physical exams. This may include:  Checking how and where your baby is positioned in your uterus.  Checking your cervix to determine: ? Whether it is thinning out (effacing). ? Whether it is opening up (dilating). What happens during labor and delivery?  Normal labor and delivery is divided into the following three stages: Stage 1  This is the longest stage of labor.  This stage can last for hours or days.  Throughout this stage,   you will feel contractions. Contractions generally feel mild, infrequent, and irregular at first. They get stronger, more frequent (about every 2-3 minutes), and more regular as you move through this stage.  This stage ends when your cervix is completely dilated to 4 inches (10 cm) and completely effaced. Stage 2  This stage starts once your cervix is completely effaced and dilated and lasts until the delivery of your baby.  This stage may last from 20 minutes to 2 hours.  This is the stage where you will feel an urge to push your baby out of  your vagina.  You may feel stretching and burning pain, especially when the widest part of your baby's head passes through the vaginal opening (crowning).  Once your baby is delivered, the umbilical cord will be clamped and cut. This usually occurs after waiting a period of 1-2 minutes after delivery.  Your baby will be placed on your bare chest (skin-to-skin contact) in an upright position and covered with a warm blanket. Watch your baby for feeding cues, like rooting or sucking, and help the baby to your breast for his or her first feeding. Stage 3  This stage starts immediately after the birth of your baby and ends after you deliver the placenta.  This stage may take anywhere from 5 to 30 minutes.  After your baby has been delivered, you will feel contractions as your body expels the placenta and your uterus contracts to control bleeding. What can I expect after labor and delivery?  After labor is over, you and your baby will be monitored closely until you are ready to go home to ensure that you are both healthy. Your health care team will teach you how to care for yourself and your baby.  You and your baby will stay in the same room (rooming in) during your hospital stay. This will encourage early bonding and successful breastfeeding.  You may continue to receive fluids and medicines through an IV.  Your uterus will be checked and massaged regularly (fundal massage).  You will have some soreness and pain in your abdomen, vagina, and the area of skin between your vaginal opening and your anus (perineum).  If an incision was made near your vagina (episiotomy) or if you had some vaginal tearing during delivery, cold compresses may be placed on your episiotomy or your tear. This helps to reduce pain and swelling.  You may be given a squirt bottle to use instead of wiping when you go to the bathroom. To use the squirt bottle, follow these steps: ? Before you urinate, fill the squirt  bottle with warm water. Do not use hot water. ? After you urinate, while you are sitting on the toilet, use the squirt bottle to rinse the area around your urethra and vaginal opening. This rinses away any urine and blood. ? Fill the squirt bottle with clean water every time you use the bathroom.  It is normal to have vaginal bleeding after delivery. Wear a sanitary pad for vaginal bleeding and discharge. Summary  Vaginal delivery means that you will give birth by pushing your baby out of your birth canal (vagina).  Your health care provider may monitor your contractions (uterine monitoring) and your baby's heart rate (fetal monitoring).  Your health care provider may perform a physical exam.  Normal labor and delivery is divided into three stages.  After labor is over, you and your baby will be monitored closely until you are ready to go home. This information   is not intended to replace advice given to you by your health care provider. Make sure you discuss any questions you have with your health care provider. Document Revised: 04/28/2017 Document Reviewed: 04/28/2017 Elsevier Patient Education  2020 Else[employer] Inc[employer]  

## 2020-04-05 NOTE — Progress Notes (Signed)
Routine Prenatal Care Visit  Subjective  Susan Price is a 19 y.o. G1P0 at [redacted]w[redacted]d being seen today for ongoing prenatal care.  She is currently monitored for the following issues for this low-risk pregnancy and has Polyarthralgia; Rheumatoid factor positive; Supervision of other normal pregnancy, antepartum; Vaginal bleeding in pregnancy, first trimester; Rh negative state in antepartum period; Abdominal pain affecting pregnancy; [redacted] weeks gestation of pregnancy; and Decreased fetal movements affecting management of mother, antepartum on their problem list.  ----------------------------------------------------------------------------------- Patient reports ongoing back pain.   Contractions: Irregular. Vag. Bleeding: None.  Movement: Present. Leaking Fluid denies.  ----------------------------------------------------------------------------------- The following portions of the patient's history were reviewed and updated as appropriate: allergies, current medications, past family history, past medical history, past social history, past surgical history and problem list. Problem list updated.  Objective  Blood pressure 112/70, weight 161 lb 3.2 oz (73.1 kg), last menstrual period 07/24/2019. Pregravid weight 122 lb (55.3 kg) Total Weight Gain 39 lb 3.2 oz (17.8 kg) Urinalysis: Urine Protein    Urine Glucose    Fetal Status: Fetal Heart Rate (bpm): 143 Fundal Height: 36 cm Movement: Present  Presentation: Vertex  General:  Alert, oriented and cooperative. Patient is in no acute distress.  Skin: Skin is warm and dry. No rash noted.   Cardiovascular: Normal heart rate noted  Respiratory: Normal respiratory effort, no problems with respiration noted  Abdomen: Soft, gravid, appropriate for gestational age. Pain/Pressure: Absent     Pelvic:  GBS and aptima collected Dilation: 1 Effacement (%): 60 Station: -2  Extremities: Normal range of motion.  Edema: None  Mental Status: Normal mood and affect.  Normal behavior. Normal judgment and thought content.   Assessment   19 y.o. G1P0 at [redacted]w[redacted]d by  04/29/2020, by Last Menstrual Period presenting for routine prenatal visit  Plan   Pregnancy #1 Problems (from 08/29/19 to present)    Problem Noted Resolved   Supervision of other normal pregnancy, antepartum 09/15/2019 by Farrel Conners, CNM No   Overview Addendum 02/28/2020  4:45 PM by Natale Milch, MD     Nursing Staff Provider  Office Location  [facility] Dating   LMP = 7wk [place]  Language  English Anatomy US  [facility] nml  Flu Vaccine  Declines Genetic Screen  NIPS:  nml XY  TDaP vaccine   02/28/2020 Hgb A1C or  GTT Third trimester : 125  Rhogam  08/29/2019 02/01/2020   LAB RESULTS   Feeding Plan bottle Blood Type O/Negative/-- (06/30 1030)   Contraception OCP Antibody Positive, See Final Results (06/30 1030)  Circumcision  Rubella 12.70 (06/30 1030)immune  Pediatrician   RPR Non Reactive (06/30 1030) NR  Support Person Jashawn HBsAg Negative (06/30 1030) negative  Prenatal Classes  HIV Non Reactive (06/30 1030)negative    Varicella immune  BTL Consent No GBS  (For PCN allergy, check sensitivities)        VBAC Consent N/A Pap   Under 21- postpartum [ ]      Hgb Electro      CF   Covid Vaccination encouraged  SMA               Previous Version       Preterm labor symptoms and general obstetric precautions including but not limited to vaginal bleeding, contractions, leaking of fluid and fetal movement were reviewed in detail with the patient. Please refer to After Visit Summary for other counseling recommendations.   Return in about 1 week (around 04/12/2020) for rob.  Tresea Mall, CNM 04/05/2020 12:16 PM

## 2020-04-05 NOTE — Progress Notes (Signed)
ROB

## 2020-04-05 NOTE — Addendum Note (Signed)
Addended by: Clement Husbands A on: 04/05/2020 04:41 PM   Modules accepted: Orders

## 2020-04-07 LAB — STREP GP B NAA: Strep Gp B NAA: NEGATIVE

## 2020-04-07 NOTE — L&D Delivery Note (Signed)
Delivery Note At 10:23 PM a viable female child was delivered via Vaginal, Spontaneous (Presentation: Right Occiput Anterior).  APGAR:8, 9; weight pending.   Placenta status: Spontaneous, Intact.  Cord: 3 vessels with the following complications: None.  Cord pH: N/A  Anesthesia: Epidural Episiotomy: None Lacerations:  Small superficial anterior labial bilaterally Suture Repair: none Est. Blood Loss (mL): 350  Mom to postpartum.  Baby to Couplet care / Skin to Skin.  Vena Austria 04/27/2020, 10:38 PM

## 2020-04-08 LAB — CERVICOVAGINAL ANCILLARY ONLY
Chlamydia: NEGATIVE
Comment: NEGATIVE
Comment: NEGATIVE
Comment: NORMAL
Neisseria Gonorrhea: NEGATIVE
Trichomonas: NEGATIVE

## 2020-04-10 ENCOUNTER — Other Ambulatory Visit: Payer: Self-pay

## 2020-04-10 ENCOUNTER — Encounter: Payer: Self-pay | Admitting: Obstetrics and Gynecology

## 2020-04-10 ENCOUNTER — Ambulatory Visit (INDEPENDENT_AMBULATORY_CARE_PROVIDER_SITE_OTHER): Payer: Medicaid Other | Admitting: Obstetrics and Gynecology

## 2020-04-10 VITALS — BP 110/70 | Ht 64.0 in | Wt 164.2 lb

## 2020-04-10 DIAGNOSIS — Z3A37 37 weeks gestation of pregnancy: Secondary | ICD-10-CM

## 2020-04-10 LAB — POCT URINALYSIS DIPSTICK OB
Glucose, UA: NEGATIVE
POC,PROTEIN,UA: NEGATIVE

## 2020-04-10 NOTE — Progress Notes (Signed)
ROB [phone]

## 2020-04-10 NOTE — Progress Notes (Signed)
Routine Prenatal Care Visit  Subjective  Susan Price is a 20 y.o. G1P0 at [redacted]w[redacted]d being seen today for ongoing prenatal care.  She is currently monitored for the following issues for this low-risk pregnancy and has Polyarthralgia; Rheumatoid factor positive; Supervision of other normal pregnancy, antepartum; Vaginal bleeding in pregnancy, first trimester; Rh negative state in antepartum period; Abdominal pain affecting pregnancy; and Decreased fetal movements affecting management of mother, antepartum on their problem list.  ----------------------------------------------------------------------------------- Patient reports feeling "very pregnant." Denies other concerns today..   Contractions: Irregular. Vag. Bleeding: None.  Movement: Present. Denies leaking of fluid.  ----------------------------------------------------------------------------------- The following portions of the patient's history were reviewed and updated as appropriate: allergies, current medications, past family history, past medical history, past social history, past surgical history and problem list. Problem list updated.   Objective  Last menstrual period 07/24/2019. Pregravid weight 122 lb (55.3 kg) Total Weight Gain 42 lb 3.2 oz (19.1 kg) Urinalysis:      Fetal Status: Fetal Heart Rate (bpm): 135 Fundal Height: 38 cm Movement: Present  Presentation: Vertex  General:  Alert, oriented and cooperative. Patient is in no acute distress.  Skin: Skin is warm and dry. No rash noted.   Cardiovascular: Normal heart rate noted  Respiratory: Normal respiratory effort, no problems with respiration noted  Abdomen: Soft, gravid, appropriate for gestational age. Pain/Pressure: Absent     Pelvic:  Cervical exam performed per patient request: Dilation: 1.5 Effacement (%): 60 Station: -2  Extremities: Normal range of motion.  Edema: None  ental Status: Normal mood and affect. Normal behavior. Normal judgment and thought  content.     Assessment   20 y.o. G1P0 at [redacted]w[redacted]d by  04/29/2020, by Last Menstrual Period presenting for routine prenatal visit  Plan   Pregnancy #1 Problems (from 08/29/19 to present)    Problem Noted Resolved   Supervision of other normal pregnancy, antepartum 09/15/2019 by Farrel Conners, CNM No   Overview Addendum 02/28/2020  4:45 PM by Natale Milch, MD     Nursing Staff Provider  Office Location  [facility] Dating   LMP = 7wk [place]  Language  English Anatomy US  [facility] nml  Flu Vaccine  Declines Genetic Screen  NIPS:  nml XY  TDaP vaccine   02/28/2020 Hgb A1C or  GTT Third trimester : 125  Rhogam  08/29/2019 02/01/2020   LAB RESULTS   Feeding Plan bottle Blood Type O/Negative/-- (06/30 1030)   Contraception OCP Antibody Positive, See Final Results (06/30 1030)  Circumcision  Rubella 12.70 (06/30 1030)immune  Pediatrician   RPR Non Reactive (06/30 1030) NR  Support Person Jashawn HBsAg Negative (06/30 1030) negative  Prenatal Classes  HIV Non Reactive (06/30 1030)negative    Varicella immune  BTL Consent No GBS  (For PCN allergy, check sensitivities)        VBAC Consent N/A Pap   Under 21- postpartum [ ]      Hgb Electro      CF   Covid Vaccination encouraged  SMA               Previous Version      -Patient with questions regarding spontaneous onset of labor vs eIOL. Reviewed risk and benefits of both. Patient considering her option but may desire an eIOL for scheduling purposes.  Term labor precautions including but not limited to vaginal bleeding, contractions, leaking of fluid and fetal movement were reviewed in detail with the patient.    Return in about  1 week (around 04/17/2020) for ROB (may schedule out for next three weeks).  Zipporah Plants, CNM, MSN [facility], [facility] 04/10/2020, 11:19 AM

## 2020-04-17 ENCOUNTER — Ambulatory Visit (INDEPENDENT_AMBULATORY_CARE_PROVIDER_SITE_OTHER): Payer: Medicaid Other | Admitting: Advanced Practice Midwife

## 2020-04-17 ENCOUNTER — Other Ambulatory Visit: Payer: Self-pay

## 2020-04-17 ENCOUNTER — Encounter: Payer: Self-pay | Admitting: Advanced Practice Midwife

## 2020-04-17 VITALS — BP 110/62 | Wt 165.0 lb

## 2020-04-17 DIAGNOSIS — Z3480 Encounter for supervision of other normal pregnancy, unspecified trimester: Secondary | ICD-10-CM

## 2020-04-17 DIAGNOSIS — Z3A38 38 weeks gestation of pregnancy: Secondary | ICD-10-CM

## 2020-04-17 LAB — POCT URINALYSIS DIPSTICK OB
Glucose, UA: NEGATIVE
POC,PROTEIN,UA: NEGATIVE

## 2020-04-17 NOTE — Progress Notes (Signed)
  Routine Prenatal Care Visit  Subjective  Susan Price is a 20 y.o. G1P0 at [redacted]w[redacted]d being seen today for ongoing prenatal care.  She is currently monitored for the following issues for this low-risk pregnancy and has Polyarthralgia; Rheumatoid factor positive; Supervision of other normal pregnancy, antepartum; Vaginal bleeding in pregnancy, first trimester; Rh negative state in antepartum period; Abdominal pain affecting pregnancy; and Decreased fetal movements affecting management of mother, antepartum on their problem list.  ----------------------------------------------------------------------------------- Patient reports pain in right ribs due to fetal position.   Contractions: Not present. Vag. Bleeding: None.  Movement: Present. Leaking Fluid denies.  ----------------------------------------------------------------------------------- The following portions of the patient's history were reviewed and updated as appropriate: allergies, current medications, past family history, past medical history, past social history, past surgical history and problem list. Problem list updated.  Objective  Blood pressure 110/62, weight 165 lb (74.8 kg), last menstrual period 07/24/2019. Pregravid weight 122 lb (55.3 kg) Total Weight Gain 43 lb (19.5 kg) Urinalysis: Urine Protein Negative  Urine Glucose Negative  Fetal Status: Fetal Heart Rate (bpm): 133 Fundal Height: 38 cm Movement: Present  Presentation: Vertex  General:  Alert, oriented and cooperative. Patient is in no acute distress.  Skin: Skin is warm and dry. No rash noted.   Cardiovascular: Normal heart rate noted  Respiratory: Normal respiratory effort, no problems with respiration noted  Abdomen: Soft, gravid, appropriate for gestational age. Pain/Pressure: Absent     Pelvic:  Cervical exam performed Dilation: 1.5 Effacement (%): 70 Station: -2, cervical sweep  Extremities: Normal range of motion.  Edema: None  Mental Status: Normal mood  and affect. Normal behavior. Normal judgment and thought content.   Assessment   20 y.o. G1P0 at [redacted]w[redacted]d by  04/29/2020, by Last Menstrual Period presenting for routine prenatal visit  Plan   Pregnancy #1 Problems (from 08/29/19 to present)    Problem Noted Resolved   Supervision of other normal pregnancy, antepartum 09/15/2019 by Farrel Conners, CNM No   Overview Addendum 02/28/2020  4:45 PM by Natale Milch, MD     Nursing Staff Provider  Office Location  [facility] Dating   LMP = 7wk [place]  Language  English Anatomy US  [facility] nml  Flu Vaccine  Declines Genetic Screen  NIPS:  nml XY  TDaP vaccine   02/28/2020 Hgb A1C or  GTT Third trimester : 125  Rhogam  08/29/2019 02/01/2020   LAB RESULTS   Feeding Plan bottle Blood Type O/Negative/-- (06/30 1030)   Contraception OCP Antibody Positive, See Final Results (06/30 1030)  Circumcision  Rubella 12.70 (06/30 1030)immune  Pediatrician   RPR Non Reactive (06/30 1030) NR  Support Person Jashawn HBsAg Negative (06/30 1030) negative  Prenatal Classes  HIV Non Reactive (06/30 1030)negative    Varicella immune  BTL Consent No GBS  (For PCN allergy, check sensitivities)        VBAC Consent N/A Pap   Under 21- postpartum [ ]      Hgb Electro      CF   Covid Vaccination encouraged  SMA               Previous Version       Term labor symptoms and general obstetric precautions including but not limited to vaginal bleeding, contractions, leaking of fluid and fetal movement were reviewed in detail with the patient.   Return in about 1 week (around 04/24/2020) for rob.  04/26/2020, CNM 04/17/2020 11:54 AM

## 2020-04-24 ENCOUNTER — Encounter: Payer: Self-pay | Admitting: Obstetrics and Gynecology

## 2020-04-24 ENCOUNTER — Other Ambulatory Visit: Payer: Self-pay

## 2020-04-24 ENCOUNTER — Ambulatory Visit (INDEPENDENT_AMBULATORY_CARE_PROVIDER_SITE_OTHER): Payer: Medicaid Other | Admitting: Obstetrics and Gynecology

## 2020-04-24 VITALS — BP 122/70 | Wt 169.0 lb

## 2020-04-24 DIAGNOSIS — Z3A39 39 weeks gestation of pregnancy: Secondary | ICD-10-CM

## 2020-04-24 DIAGNOSIS — Z3403 Encounter for supervision of normal first pregnancy, third trimester: Secondary | ICD-10-CM

## 2020-04-24 DIAGNOSIS — Z6791 Unspecified blood type, Rh negative: Secondary | ICD-10-CM

## 2020-04-24 DIAGNOSIS — O26899 Other specified pregnancy related conditions, unspecified trimester: Secondary | ICD-10-CM

## 2020-04-24 NOTE — Progress Notes (Signed)
OB History & Physical   History of Present Illness:  Chief Complaint: induction of labor  HPI:  Susan Price is a 20 y.o. G1P0 female at [redacted]w[redacted]d dated by LMP.  Her pregnancy has been complicated by vaginal bleeding early in pregnancy, Rh negative status.    She denies contractions.   She denies leakage of fluid.   She denies vaginal bleeding.   She reports fetal movement.    Total weight gain for pregnancy: 47 lb (21.3 kg)   Obstetrical Problem List: Pregnancy #1 Problems (from 08/29/19 to present)    Problem Noted Resolved   Supervision of other normal pregnancy, antepartum 09/15/2019 by Farrel Conners, CNM No   Overview Addendum 02/28/2020  4:45 PM by Natale Milch, MD     Nursing Staff Provider  Office Location  [facility] Dating   LMP = 7wk [place]  Language  English Anatomy US  [facility] nml  Flu Vaccine  Declines Genetic Screen  NIPS:  nml XY  TDaP vaccine   02/28/2020 Hgb A1C or  GTT Third trimester : 125  Rhogam  08/29/2019 02/01/2020   LAB RESULTS   Feeding Plan bottle Blood Type O/Negative/-- (06/30 1030)   Contraception OCP Antibody Positive, See Final Results (06/30 1030)  Circumcision  Rubella 12.70 (06/30 1030)immune  Pediatrician   RPR Non Reactive (06/30 1030) NR  Support Person Jashawn HBsAg Negative (06/30 1030) negative  Prenatal Classes  HIV Non Reactive (06/30 1030)negative    Varicella immune  BTL Consent No GBS  (For PCN allergy, check sensitivities)        VBAC Consent N/A Pap   Under 21- postpartum [ ]      Hgb Electro      CF   Covid Vaccination encouraged  SMA               Previous Version       Maternal Medical History:   Past Medical History:  Diagnosis Date  . Depression     Past Surgical History:  Procedure Laterality Date  . TONSILLECTOMY AND ADENOIDECTOMY      No Known Allergies  Prior to Admission medications   Medication Sig Start Date End Date Taking? Authorizing Provider  Prenatal Vit-Fe Fumarate-FA  (MULTIVITAMIN-PRENATAL) 27-0.8 MG TABS tablet Take 1 tablet by mouth daily at 12 noon.    [provider]    OB History  Gravida Para Term Preterm AB Living  1            SAB IAB Ectopic Multiple Live Births               # Outcome Date GA Lbr Len/2nd Weight Sex Delivery Anes PTL Lv  1 Current             Prenatal care site: [facility]  Social History: She  reports that she has quit smoking. Her smoking use included cigarettes. She has never used smokeless tobacco. She reports previous drug use. Drug: Marijuana. She reports that she does not drink alcohol.  Family History: family history includes Anxiety disorder in her mother.   Review of Systems:  ROS   Physical Exam:  BP 122/70   Wt 169 lb (76.7 kg)   LMP 07/24/2019 (Exact Date)   BMI 29.01 kg/m   Physical Exam Constitutional:      General: She is not in acute distress.    Appearance: Normal appearance. She is well-developed.  Genitourinary:     Genitourinary Comments: 2 dm, see note from  clinic visit  HENT:     Head: Normocephalic and atraumatic.  Eyes:     General: No scleral icterus.    Conjunctiva/sclera: Conjunctivae normal.  Cardiovascular:     Rate and Rhythm: Normal rate and regular rhythm.     Heart sounds: No murmur heard. No friction rub. No gallop.   Pulmonary:     Effort: Pulmonary effort is normal. No respiratory distress.     Breath sounds: Normal breath sounds. No wheezing or rales.  Abdominal:     General: Bowel sounds are normal. There is no distension.     Palpations: Abdomen is soft. There is mass (gravid).     Tenderness: There is no abdominal tenderness. There is no guarding or rebound.  Musculoskeletal:        General: Normal range of motion.     Cervical back: Normal range of motion and neck supple.  Neurological:     General: No focal deficit present.     Mental Status: She is alert and oriented to person, place, and time.     Cranial Nerves: No cranial nerve deficit.   Skin:    General: Skin is warm and dry.     Findings: No erythema.  Psychiatric:        Mood and Affect: Mood normal.        Behavior: Behavior normal.        Judgment: Judgment normal.     Female chaperone present for pelvic exam:   No results found for: [id number]  Assessment:  Susan Price is a 20 y.o. G1P0 female at [redacted]w[redacted]d with desire for induction of labor.   Plan:  1. Admit to Labor & Delivery  2. CBC, T&S, Clrs, IVF 3. GBS negative.   4. IOL date is 04/28/20 at 0800, COVID19 testing on 04/26/2020 5. Discusseed IOL with patient. Precautions for presenting to hospital prior to this date/dtime given.   Thomasene Mohair, MD 04/24/2020 12:38 PM

## 2020-04-24 NOTE — Progress Notes (Signed)
Routine Prenatal Care Visit  Subjective  Susan Price is a 20 y.o. G1P0 at [redacted]w[redacted]d being seen today for ongoing prenatal care.  She is currently monitored for the following issues for this low-risk pregnancy and has Polyarthralgia; Rheumatoid factor positive; Supervision of other normal pregnancy, antepartum; Vaginal bleeding in pregnancy, first trimester; Rh negative state in antepartum period; Abdominal pain affecting pregnancy; and Decreased fetal movements affecting management of mother, antepartum on their problem list.  ----------------------------------------------------------------------------------- Patient reports no complaints.   Contractions: Irritability. Vag. Bleeding: None.  Movement: Present. Leaking Fluid denies.  ----------------------------------------------------------------------------------- The following portions of the patient's history were reviewed and updated as appropriate: allergies, current medications, past family history, past medical history, past social history, past surgical history and problem list. Problem list updated.  Objective  Blood pressure 122/70, weight 169 lb (76.7 kg), last menstrual period 07/24/2019. Pregravid weight 122 lb (55.3 kg) Total Weight Gain 47 lb (21.3 kg) Urinalysis: Urine Protein    Urine Glucose    Fetal Status: Fetal Heart Rate (bpm): 135 Fundal Height: 39 cm Movement: Present  Presentation: Vertex  General:  Alert, oriented and cooperative. Patient is in no acute distress.  Skin: Skin is warm and dry. No rash noted.   Cardiovascular: Normal heart rate noted  Respiratory: Normal respiratory effort, no problems with respiration noted  Abdomen: Soft, gravid, appropriate for gestational age. Pain/Pressure: Absent     Pelvic:  Cervical exam performed Dilation: 2 Effacement (%): 70 Station: -2  Extremities: Normal range of motion.  Edema: None  Mental Status: Normal mood and affect. Normal behavior. Normal judgment and thought  content.   Assessment   20 y.o. G1P0 at [redacted]w[redacted]d by  04/29/2020, by Last Menstrual Period presenting for routine prenatal visit  Plan   Pregnancy #1 Problems (from 08/29/19 to present)    Problem Noted Resolved   Supervision of other normal pregnancy, antepartum 09/15/2019 by Farrel Conners, CNM No   Overview Addendum 02/28/2020  4:45 PM by Natale Milch, MD     Nursing Staff Provider  Office Location  [facility] Dating   LMP = 7wk [place]  Language  English Anatomy US  [facility] nml  Flu Vaccine  Declines Genetic Screen  NIPS:  nml XY  TDaP vaccine   02/28/2020 Hgb A1C or  GTT Third trimester : 125  Rhogam  08/29/2019 02/01/2020   LAB RESULTS   Feeding Plan bottle Blood Type O/Negative/-- (06/30 1030)   Contraception OCP Antibody Positive, See Final Results (06/30 1030)  Circumcision  Rubella 12.70 (06/30 1030)immune  Pediatrician   RPR Non Reactive (06/30 1030) NR  Support Person Jashawn HBsAg Negative (06/30 1030) negative  Prenatal Classes  HIV Non Reactive (06/30 1030)negative    Varicella immune  BTL Consent No GBS  (For PCN allergy, check sensitivities)        VBAC Consent N/A Pap   Under 21- postpartum [ ]      Hgb Electro      CF   Covid Vaccination encouraged  SMA               Previous Version       Term labor symptoms and general obstetric precautions including but not limited to vaginal bleeding, contractions, leaking of fluid and fetal movement were reviewed in detail with the patient. Please refer to After Visit Summary for other counseling recommendations.   Return if symptoms worsen or fail to improve.   , MD, [facility] Moh[facility], [facility] 04/24/2020  12:38 PM

## 2020-04-24 NOTE — Patient Instructions (Signed)
Induction Information: Your induction date: 04/28/2020 at 0800 AM Covid test: 1/20 between 9-10AM. Go to the [facility] drive-through.  Wear a mask and stay in your car.   This should not take long.  On your induction date, go to the ER a little before your induction time and let them know you're there for your labor induction.

## 2020-04-26 ENCOUNTER — Other Ambulatory Visit
Admission: RE | Admit: 2020-04-26 | Discharge: 2020-04-26 | Disposition: A | Discharge status: Home / Self Care | Payer: Medicaid Other | Source: Ambulatory Visit | Attending: Obstetrics and Gynecology | Admitting: Obstetrics and Gynecology

## 2020-04-26 ENCOUNTER — Other Ambulatory Visit: Payer: Self-pay

## 2020-04-26 DIAGNOSIS — Z20822 Contact with and (suspected) exposure to covid-19: Secondary | ICD-10-CM | POA: Insufficient documentation

## 2020-04-26 DIAGNOSIS — Z01812 Encounter for preprocedural laboratory examination: Secondary | ICD-10-CM | POA: Insufficient documentation

## 2020-04-26 LAB — SARS CORONAVIRUS 2 (TAT 6-24 HRS): SARS Coronavirus 2: NEGATIVE

## 2020-04-27 ENCOUNTER — Inpatient Hospital Stay
Admission: EM | Admit: 2020-04-27 | Discharge: 2020-04-29 | DRG: 806 | Disposition: A | Discharge status: Home / Self Care | Payer: Medicaid Other | Attending: Obstetrics and Gynecology | Admitting: Obstetrics and Gynecology

## 2020-04-27 ENCOUNTER — Encounter: Payer: Self-pay | Admitting: Obstetrics and Gynecology

## 2020-04-27 ENCOUNTER — Inpatient Hospital Stay: Payer: Medicaid Other | Admitting: Anesthesiology

## 2020-04-27 ENCOUNTER — Other Ambulatory Visit: Payer: Self-pay

## 2020-04-27 DIAGNOSIS — Z20822 Contact with and (suspected) exposure to covid-19: Secondary | ICD-10-CM | POA: Diagnosis present

## 2020-04-27 DIAGNOSIS — Z3490 Encounter for supervision of normal pregnancy, unspecified, unspecified trimester: Secondary | ICD-10-CM | POA: Diagnosis present

## 2020-04-27 DIAGNOSIS — Z3A39 39 weeks gestation of pregnancy: Secondary | ICD-10-CM

## 2020-04-27 DIAGNOSIS — Z87891 Personal history of nicotine dependence: Secondary | ICD-10-CM | POA: Diagnosis not present

## 2020-04-27 DIAGNOSIS — O9081 Anemia of the puerperium: Principal | ICD-10-CM | POA: Diagnosis not present

## 2020-04-27 DIAGNOSIS — O26893 Other specified pregnancy related conditions, third trimester: Secondary | ICD-10-CM | POA: Diagnosis present

## 2020-04-27 DIAGNOSIS — D62 Acute posthemorrhagic anemia: Secondary | ICD-10-CM | POA: Diagnosis not present

## 2020-04-27 DIAGNOSIS — Z3480 Encounter for supervision of other normal pregnancy, unspecified trimester: Secondary | ICD-10-CM

## 2020-04-27 LAB — TYPE AND SCREEN
ABO/RH(D): O NEG
Antibody Screen: POSITIVE

## 2020-04-27 LAB — CBC
HCT: 39.2 % (ref 36.0–46.0)
Hemoglobin: 13.4 g/dL (ref 12.0–15.0)
MCH: 30.5 pg (ref 26.0–34.0)
MCHC: 34.2 g/dL (ref 30.0–36.0)
MCV: 89.1 fL (ref 80.0–100.0)
Platelets: 211 10*3/uL (ref 150–400)
RBC: 4.4 MIL/uL (ref 3.87–5.11)
RDW: 12.9 % (ref 11.5–15.5)
WBC: 13.9 10*3/uL — ABNORMAL HIGH (ref 4.0–10.5)
nRBC: 0 % (ref 0.0–0.2)

## 2020-04-27 MED ORDER — FENTANYL 2.5 MCG/ML W/ROPIVACAINE 0.15% IN NS 100 ML EPIDURAL (ARMC)
12.0000 mL/h | EPIDURAL | Status: DC
  Administered 2020-04-27: 12 mL/h via EPIDURAL

## 2020-04-27 MED ORDER — OXYTOCIN-SODIUM CHLORIDE 30-0.9 UT/500ML-% IV SOLN
2.5000 [IU]/h | INTRAVENOUS | Status: DC
  Administered 2020-04-28: 2.5 [IU]/h via INTRAVENOUS
  Filled 2020-04-27 (×2): qty 500

## 2020-04-27 MED ORDER — LACTATED RINGERS IV SOLN: 500.0000 mL | INTRAVENOUS | Status: DC | PRN

## 2020-04-27 MED ORDER — ACETAMINOPHEN 325 MG PO TABS
650.0000 mg | ORAL_TABLET | ORAL | Status: DC | PRN
  Administered 2020-04-28 – 2020-04-29 (×4): 650 mg via ORAL
  Filled 2020-04-27 (×5): qty 2

## 2020-04-27 MED ORDER — LIDOCAINE HCL (PF) 1 % IJ SOLN: 30.0000 mL | INTRAMUSCULAR | Status: DC | PRN

## 2020-04-27 MED ORDER — IBUPROFEN 600 MG PO TABS
600.0000 mg | ORAL_TABLET | Freq: Four times a day (QID) | ORAL | Status: DC
  Administered 2020-04-28 – 2020-04-29 (×6): 600 mg via ORAL
  Filled 2020-04-27 (×6): qty 1

## 2020-04-27 MED ORDER — AMMONIA AROMATIC IN INHA
RESPIRATORY_TRACT | Status: AC
  Filled 2020-04-27: qty 10

## 2020-04-27 MED ORDER — DIPHENHYDRAMINE HCL 50 MG/ML IJ SOLN: 12.5000 mg | INTRAMUSCULAR | Status: DC | PRN

## 2020-04-27 MED ORDER — LACTATED RINGERS IV SOLN: INTRAVENOUS | Status: DC

## 2020-04-27 MED ORDER — OXYTOCIN 10 UNIT/ML IJ SOLN
INTRAMUSCULAR | Status: AC
  Filled 2020-04-27: qty 2

## 2020-04-27 MED ORDER — LACTATED RINGERS IV SOLN
500.0000 mL | Freq: Once | INTRAVENOUS | Status: AC
  Administered 2020-04-27: 500 mL via INTRAVENOUS

## 2020-04-27 MED ORDER — PHENYLEPHRINE 40 MCG/ML (10ML) SYRINGE FOR IV PUSH (FOR BLOOD PRESSURE SUPPORT): 80.0000 ug | PREFILLED_SYRINGE | INTRAVENOUS | Status: DC | PRN

## 2020-04-27 MED ORDER — LIDOCAINE HCL (PF) 1 % IJ SOLN
INTRAMUSCULAR | Status: DC | PRN
  Administered 2020-04-27: 2 mL

## 2020-04-27 MED ORDER — EPHEDRINE 5 MG/ML INJ: 10.0000 mg | INTRAVENOUS | Status: DC | PRN

## 2020-04-27 MED ORDER — TERBUTALINE SULFATE 1 MG/ML IJ SOLN: 0.2500 mg | Freq: Once | INTRAMUSCULAR | Status: DC | PRN

## 2020-04-27 MED ORDER — BUPIVACAINE HCL (PF) 0.25 % IJ SOLN
INTRAMUSCULAR | Status: DC | PRN
  Administered 2020-04-27: 4 mL via EPIDURAL

## 2020-04-27 MED ORDER — LIDOCAINE-EPINEPHRINE (PF) 1.5 %-1:200000 IJ SOLN
INTRAMUSCULAR | Status: DC | PRN
  Administered 2020-04-27: 4 mL via PERINEURAL

## 2020-04-27 MED ORDER — IBUPROFEN 600 MG PO TABS
ORAL_TABLET | ORAL | Status: AC
  Administered 2020-04-27: 600 mg
  Filled 2020-04-27: qty 1

## 2020-04-27 MED ORDER — OXYTOCIN 10 UNIT/ML IJ SOLN: 10.0000 [IU] | Freq: Once | INTRAMUSCULAR | Status: DC

## 2020-04-27 MED ORDER — MISOPROSTOL 200 MCG PO TABS
ORAL_TABLET | ORAL | Status: AC
  Filled 2020-04-27: qty 4

## 2020-04-27 MED ORDER — ONDANSETRON HCL 4 MG/2ML IJ SOLN: 4.0000 mg | Freq: Four times a day (QID) | INTRAMUSCULAR | Status: DC | PRN

## 2020-04-27 MED ORDER — SOD CITRATE-CITRIC ACID 500-334 MG/5ML PO SOLN: 30.0000 mL | ORAL | Status: DC | PRN

## 2020-04-27 MED ORDER — MISOPROSTOL 25 MCG QUARTER TABLET: 25.0000 ug | ORAL_TABLET | ORAL | Status: DC | PRN

## 2020-04-27 MED ORDER — OXYTOCIN BOLUS FROM INFUSION
333.0000 mL | Freq: Once | INTRAVENOUS | Status: AC
  Administered 2020-04-27: 333 mL via INTRAVENOUS

## 2020-04-27 MED ORDER — FENTANYL 2.5 MCG/ML W/ROPIVACAINE 0.15% IN NS 100 ML EPIDURAL (ARMC)
EPIDURAL | Status: AC
  Filled 2020-04-27: qty 100

## 2020-04-27 MED ORDER — LIDOCAINE HCL (PF) 1 % IJ SOLN
INTRAMUSCULAR | Status: AC
  Filled 2020-04-27: qty 30

## 2020-04-27 MED ORDER — BENZOCAINE-MENTHOL 20-0.5 % EX AERO
1.0000 "application " | INHALATION_SPRAY | CUTANEOUS | Status: DC | PRN
  Administered 2020-04-28: 1 via TOPICAL
  Filled 2020-04-27: qty 56

## 2020-04-27 NOTE — Discharge Summary (Signed)
OB Discharge Summary     Patient Name: Susan Price DOB: 08-Aug-2000 MRN: [id number]  Date of admission: 04/27/2020 Delivering MD: Vena Austria   Date of discharge: 04/29/2020  Admitting diagnosis: Labor and delivery, indication for care [O75.9] Encounter for elective induction of labor [Z34.90] Intrauterine pregnancy: [redacted]w[redacted]d     Secondary diagnosis:  Active Problems: Normal labor  Additional problems: Normal labor     Discharge diagnosis: Term Pregnancy Delivered                                                                                                Post partum procedures:rhogam  Augmentation: N/A  Complications: None  Hospital course:  Onset of Labor With Vaginal Delivery      20 y.o. yo G1P0 at [redacted]w[redacted]d was admitted in Latent Labor on 04/27/2020. Patient had an uncomplicated labor course as follows:  Membrane Rupture Time/Date: 7:08 PM ,04/27/2020   Delivery Method:Vaginal, Spontaneous  Episiotomy: None  Lacerations:    Patient had an uncomplicated postpartum course.  She is ambulating, tolerating a regular diet, passing flatus, and urinating well. Patient is discharged home in stable condition on 04/29/20.  Newborn Data: Birth date:04/27/2020  Birth time:10:23 PM  Gender:Female  Living status:Living  Apgars:9 ,9  Weight:3360 g   Physical exam  Vitals:   04/28/20 0819 04/28/20 1138 04/28/20 2258 04/29/20 0710  BP: 108/71 (!) 110/55 101/67 114/70  Pulse: 79 94 70 68  Resp: 18 18 20 18   Temp: (!) 97.5 F (36.4 C) 98.3 F (36.8 C) 98.1 F (36.7 C) 97.6 F (36.4 C)  TempSrc: Oral Oral Oral Oral  SpO2: 98% 99% 99% 99%  Weight:      Height:       General: alert, cooperative and no distress Lochia: appropriate Uterine Fundus: firm Incision: N/A DVT Evaluation: No evidence of DVT seen on physical exam. Labs: Lab Results  Component Value Date   WBC 15.4 (H) 04/28/2020   HGB 12.3 04/28/2020   HCT 34.8 (L) 04/28/2020   MCV 88.5 04/28/2020   PLT 178  04/28/2020   CMP Latest Ref Rng & Units 10/03/2019  Glucose 70 - 99 mg/dL 10/05/2019)  BUN 6 - 20 mg/dL 10  Creatinine 025(K - 2.70 mg/dL 6.23  Sodium 7.62 - 831 mmol/L 137  Potassium 3.5 - 5.1 mmol/L 3.8  Chloride 98 - 111 mmol/L 104  CO2 22 - 32 mmol/L 24  Calcium 8.9 - 10.3 mg/dL 9.2    Discharge instruction: per After Visit Summary and "Baby and Me Booklet".  After visit meds:  Allergies as of 04/29/2020   No Known Allergies     Medication List    TAKE these medications   acetaminophen 325 MG tablet Commonly known as: Tylenol Take 2 tablets (650 mg total) by mouth every 4 (four) hours as needed (for pain scale < 4).   docusate sodium 100 MG capsule Commonly known as: Colace Take 1 capsule (100 mg total) by mouth 2 (two) times daily.   ibuprofen 600 MG tablet Commonly known as: ADVIL Take 1 tablet (600 mg total) by mouth every  6 (six) hours.   multivitamin-prenatal 27-0.8 MG Tabs tablet Take 1 tablet by mouth daily at 12 noon.   polyethylene glycol 17 g packet Commonly known as: MiraLax Take 17 g by mouth daily.            Discharge Care Instructions  (From admission, onward)         Start     Ordered   04/29/20 0000  Discharge wound care:       Comments: SHOWER DAILY Wash incision gently with soap and water.  Call office with any drainage, redness, or firmness of the incision.   04/29/20 1032          Diet: routine diet  Activity: Advance as tolerated. Pelvic rest for 6 weeks.   Outpatient follow up:6 weeks Follow up Appt:No future appointments. Follow up Visit:No follow-ups on file.  Postpartum contraception: Combination OCPs  Newborn Data: Live born child  Birth Weight:   APGAR: ,   Newborn Delivery   Birth date/time: 04/27/2020 22:23:00 Delivery type: Vaginal, Spontaneous      Baby Feeding: Bottle and Breast Disposition:home with mother   04/29/2020 Natale Milch, MD

## 2020-04-27 NOTE — H&P (Signed)
History and Physical Interval Note:  04/27/2020 3:15 PM   Susan Price was scheduled for INDUCTION OF LABOR tomorrow. She presents today with regular strong contractions that began early this morning that are increasing in frequency and intensity. Her cervix has changed while in triage from 3.5 to 4.5 cm per Maryln Manuel RN. She was 2 cm 3 days ago in clinic. She will be admitted for labor and augment as needed. She is requesting pain medication. The patient's history has been reviewed, patient examined. See H&P. I have reviewed the patient's chart and labs.  Questions were answered to the patient's satisfaction.     Tresea Mall, CNM [facility] 04/27/2020  3:15 PM

## 2020-04-27 NOTE — Anesthesia Procedure Notes (Signed)
Epidural Patient location during procedure: OB Start time: 04/27/2020 4:29 PM End time: 04/27/2020 4:58 PM  Staffing Anesthesiologist: Lenard Simmer, MD Resident/CRNA: Ginger Carne, CRNA Performed: resident/CRNA   Preanesthetic Checklist Completed: patient identified, IV checked, site marked, risks and benefits discussed, surgical consent, monitors and equipment checked, pre-op evaluation and timeout performed  Epidural Patient position: sitting Prep: ChloraPrep Patient monitoring: heart rate, continuous pulse ox and blood pressure Approach: midline Location: L3-L4 Injection technique: LOR saline  Needle:  Needle type: Tuohy  Needle gauge: 17 G Needle length: 9 cm and 9 Needle insertion depth: 8 cm Catheter type: closed end flexible Catheter size: 19 Gauge Catheter at skin depth: 13 cm Test dose: negative and 1.5% lidocaine with Epi 1:200 K  Assessment Sensory level: T10 Events: blood not aspirated, injection not painful, no injection resistance, no paresthesia and negative IV test  Additional Notes 1 attempt Pt. Evaluated and documentation done after procedure finished. Patient identified. Risks/Benefits/Options discussed with patient including but not limited to bleeding, infection, nerve damage, paralysis, failed block, incomplete pain control, headache, blood pressure changes, nausea, vomiting, reactions to medication both or allergic, itching and postpartum back pain. Confirmed with bedside nurse the patient's most recent platelet count. Confirmed with patient that they are not currently taking any anticoagulation, have any bleeding history or any family history of bleeding disorders. Patient expressed understanding and wished to proceed. All questions were answered. Sterile technique was used throughout the entire procedure. Please see nursing notes for vital signs. Test dose was given through epidural catheter and negative prior to continuing to dose epidural or  start infusion. Warning signs of high block given to the patient including shortness of breath, tingling/numbness in hands, complete motor block, or any concerning symptoms with instructions to call for help. Patient was given instructions on fall risk and not to get out of bed. All questions and concerns addressed with instructions to call with any issues or inadequate analgesia.   Patient tolerated the insertion well without immediate complications.Reason for block:procedure for pain

## 2020-04-27 NOTE — Anesthesia Preprocedure Evaluation (Addendum)
Anesthesia Evaluation  Patient identified by MRN, date of birth, ID band Patient awake    Reviewed: Allergy & Precautions, H&P , NPO status , Patient's Chart, lab work & pertinent test results  History of Anesthesia Complications Negative for: history of anesthetic complications  Airway Mallampati: II       Dental   Pulmonary neg pulmonary ROS, former smoker,           Cardiovascular Exercise Tolerance: Good negative cardio ROS       Neuro/Psych negative neurological ROS     GI/Hepatic Neg liver ROS, GERD  Controlled,  Endo/Other  negative endocrine ROS  Renal/GU negative Renal ROS  negative genitourinary   Musculoskeletal   Abdominal   Peds  Hematology negative hematology ROS (+)   Anesthesia Other Findings   Reproductive/Obstetrics (+) Pregnancy                            Anesthesia Physical Anesthesia Plan  ASA: II  Anesthesia Plan: Epidural   Post-op Pain Management:    Induction:   PONV Risk Score and Plan:   Airway Management Planned:   Additional Equipment:   Intra-op Plan:   Post-operative Plan:   Informed Consent:     Dental Advisory Given  Plan Discussed with: Anesthesiologist  Anesthesia Plan Comments:         Anesthesia Quick Evaluation

## 2020-04-27 NOTE — Progress Notes (Addendum)
Paper charting strip due to OBIX problems. RN at bedside since 1940 to monitor FHT/CTX. Paper strip sent to medical records.

## 2020-04-28 ENCOUNTER — Encounter: Payer: Self-pay | Admitting: Obstetrics and Gynecology

## 2020-04-28 LAB — CBC
HCT: 34.8 % — ABNORMAL LOW (ref 36.0–46.0)
Hemoglobin: 12.3 g/dL (ref 12.0–15.0)
MCH: 31.3 pg (ref 26.0–34.0)
MCHC: 35.3 g/dL (ref 30.0–36.0)
MCV: 88.5 fL (ref 80.0–100.0)
Platelets: 178 10*3/uL (ref 150–400)
RBC: 3.93 MIL/uL (ref 3.87–5.11)
RDW: 12.9 % (ref 11.5–15.5)
WBC: 15.4 10*3/uL — ABNORMAL HIGH (ref 4.0–10.5)
nRBC: 0 % (ref 0.0–0.2)

## 2020-04-28 LAB — RPR: RPR Ser Ql: NONREACTIVE

## 2020-04-28 LAB — FETAL SCREEN: Fetal Screen: NEGATIVE

## 2020-04-28 MED ORDER — PRENATAL MULTIVITAMIN CH
1.0000 | ORAL_TABLET | Freq: Every day | ORAL | Status: DC
  Administered 2020-04-28 – 2020-04-29 (×2): 1 via ORAL
  Filled 2020-04-28 (×2): qty 1

## 2020-04-28 MED ORDER — ONDANSETRON HCL 4 MG PO TABS
4.0000 mg | ORAL_TABLET | ORAL | Status: DC | PRN
  Filled 2020-04-28: qty 1

## 2020-04-28 MED ORDER — SENNOSIDES-DOCUSATE SODIUM 8.6-50 MG PO TABS
2.0000 | ORAL_TABLET | ORAL | Status: DC
  Administered 2020-04-27 – 2020-04-28 (×3): 2 via ORAL
  Filled 2020-04-28 (×2): qty 2

## 2020-04-28 MED ORDER — DIBUCAINE (PERIANAL) 1 % EX OINT
1.0000 "application " | TOPICAL_OINTMENT | CUTANEOUS | Status: DC | PRN
  Administered 2020-04-28: 1 via RECTAL
  Filled 2020-04-28 (×2): qty 28

## 2020-04-28 MED ORDER — OXYCODONE-ACETAMINOPHEN 5-325 MG PO TABS
1.0000 | ORAL_TABLET | ORAL | Status: DC | PRN
  Administered 2020-04-29: 1 via ORAL
  Filled 2020-04-28: qty 1

## 2020-04-28 MED ORDER — COCONUT OIL OIL
1.0000 "application " | TOPICAL_OIL | Status: DC | PRN
  Filled 2020-04-28: qty 120

## 2020-04-28 MED ORDER — SIMETHICONE 80 MG PO CHEW
80.0000 mg | CHEWABLE_TABLET | ORAL | Status: DC | PRN
  Filled 2020-04-28: qty 1

## 2020-04-28 MED ORDER — ONDANSETRON HCL 4 MG/2ML IJ SOLN: 4.0000 mg | INTRAMUSCULAR | Status: DC | PRN

## 2020-04-28 MED ORDER — DIPHENHYDRAMINE HCL 25 MG PO CAPS: 25.0000 mg | ORAL_CAPSULE | Freq: Four times a day (QID) | ORAL | Status: DC | PRN

## 2020-04-28 MED ORDER — RHO D IMMUNE GLOBULIN 1500 UNIT/2ML IJ SOSY
300.0000 ug | PREFILLED_SYRINGE | Freq: Once | INTRAMUSCULAR | Status: AC
  Administered 2020-04-28: 300 ug via INTRAVENOUS
  Filled 2020-04-28: qty 2

## 2020-04-28 MED ORDER — WITCH HAZEL-GLYCERIN EX PADS
1.0000 | MEDICATED_PAD | CUTANEOUS | Status: DC | PRN
Start: 2020-04-27 — End: 2020-04-29
  Administered 2020-04-28: 1 via TOPICAL
  Filled 2020-04-28 (×2): qty 100

## 2020-04-28 MED ORDER — OXYCODONE-ACETAMINOPHEN 5-325 MG PO TABS: 2.0000 | ORAL_TABLET | ORAL | Status: DC | PRN

## 2020-04-28 NOTE — Lactation Note (Signed)
This note was copied from a baby's chart. Lactation Consultation Note  Patient Name: Susan Price Date: 04/28/2020 Reason for consult: Follow-up assessment;Mother's request Age:22 hours  LC reviewed pump setup, cleaning, milk storage guidelines, and flange fit, along with hand expression and age appropriate volumes of EBM or formula. Mother stated understanding with all teaching and has no further questions at this time.   Maternal Data Has patient been taught Hand Expression?: Yes Does the patient have breastfeeding experience prior to this delivery?: No  Interventions Interventions: Hand pump;DEBP;Hand express;Skin to skin    Consult Status Consult Status: Follow-up Date: 04/29/20 Follow-up type: Call as needed    Jalicia Roszak D Zafar Debrosse 04/28/2020, 3:54 PM

## 2020-04-28 NOTE — Anesthesia Postprocedure Evaluation (Signed)
Anesthesia Post Note  Patient: Susan DELEEUW  Procedure(s) Performed: AN AD HOC LABOR EPIDURAL  Patient location during evaluation: Mother Baby Anesthesia Type: Epidural Level of consciousness: awake and alert Pain management: pain level controlled Vital Signs Assessment: post-procedure vital signs reviewed and stable Respiratory status: spontaneous breathing, nonlabored ventilation and respiratory function stable Cardiovascular status: stable Postop Assessment: no headache, no backache, epidural receding, patient able to bend at knees and able to ambulate Anesthetic complications: no   No complications documented.   Last Vitals:  Vitals:   04/28/20 0339 04/28/20 0819  BP: 103/63 108/71  Pulse: 63 79  Resp: 20 18  Temp: 36.7 C (!) 36.4 C  SpO2: 99% 98%    Last Pain:  Vitals:   04/28/20 0819  TempSrc: Oral  PainSc:                  Karleen Hampshire

## 2020-04-28 NOTE — Progress Notes (Signed)
  Subjective:  She is feeling well. Pain control is moderately controlled. Voiding without difficulty, but some discomfort. Tolerating a regular diet. Ambulating well.  Objective:   Blood pressure 108/71, pulse 79, temperature (!) 97.5 F (36.4 C), temperature source Oral, resp. rate 18, height 5\' 4"  (1.626 m), weight 76.2 kg, last menstrual period 07/24/2019, SpO2 98 %, unknown if currently breastfeeding.  General: NAD Pulmonary: no increased work of breathing Abdomen: non-distended, non-tender Uterus:  fundus firm at U; lochia normal. Extremities: no edema, no erythema, no tenderness, no signs of DVT  Results for orders placed or performed during the hospital encounter of 04/27/20 (from the past 72 hour(s))  Type and screen     Status: None   Collection Time: 04/27/20  3:47 PM  Result Value Ref Range   ABO/RH(D) O NEG    Antibody Screen POS    Sample Expiration 04/30/2020,2359    Antibody Identification      PASSIVELY ACQUIRED ANTI-D Performed at [facility], [address] [place], [place]   CBC     Status: Abnormal   Collection Time: 04/27/20  3:56 PM  Result Value Ref Range   WBC 13.9 (H) 4.0 - 10.5 K/uL   RBC 4.40 3.87 - 5.11 MIL/uL   Hemoglobin 13.4 12.0 - 15.0 g/dL   HCT 04/29/20 50.0 - 93.8 %   MCV 89.1 80.0 - 100.0 fL   MCH 30.5 26.0 - 34.0 pg   MCHC 34.2 30.0 - 36.0 g/dL   RDW 18.2 99.3 - 71.6 %   Platelets 211 150 - 400 K/uL   nRBC 0.0 0.0 - 0.2 %    Comment: Performed at [facility], [address] [place], [place]  CBC     Status: Abnormal   Collection Time: 04/28/20  6:26 AM  Result Value Ref Range   WBC 15.4 (H) 4.0 - 10.5 K/uL   RBC 3.93 3.87 - 5.11 MIL/uL   Hemoglobin 12.3 12.0 - 15.0 g/dL   HCT 04/30/20 (L) 01.7 - 51.0 %   MCV 88.5 80.0 - 100.0 fL   MCH 31.3 26.0 - 34.0 pg   MCHC 35.3 30.0 - 36.0 g/dL   RDW 25.8 52.7 - 78.2 %   Platelets 178 150 - 400 K/uL   nRBC 0.0 0.0 - 0.2 %    Comment: Performed at  [facility], [address], [place], [place]    Assessment:   20 y.o. G1P1001 postpartum day # 1  Plan:   1) Acute blood loss anemia - hemodynamically stable and asymptomatic - po ferrous sulfate  2) Blood Type --/--/O NEG (01/21 1547)   3) Rubella 12.70 (06/30 1030) / Varicella Immune  4) TDAP status - up todate Immunization History  Administered Date(s) Administered  . Tdap 02/28/2020    5) Feeding- Bottle and breast   6) Contraception - OCP  7) Disposition - home tomorrow  03/01/2020 MD [facility], [facility] 04/28/2020 10:16 AM

## 2020-04-28 NOTE — Plan of Care (Signed)
  Problem: Education: Goal: Knowledge of General Education information will improve Description: Including pain rating scale, medication(s)/side effects and non-pharmacologic comfort measures Outcome: Progressing   Problem: Health Behavior/Discharge Planning: Goal: Ability to manage health-related needs will improve Outcome: Progressing   Problem: Clinical Measurements: Goal: Ability to maintain clinical measurements within normal limits will improve Outcome: Progressing Goal: Will remain free from infection Outcome: Progressing Goal: Diagnostic test results will improve Outcome: Progressing Goal: Respiratory complications will improve Outcome: Progressing Goal: Cardiovascular complication will be avoided Outcome: Progressing   Problem: Activity: Goal: Risk for activity intolerance will decrease Outcome: Progressing   Problem: Nutrition: Goal: Adequate nutrition will be maintained Outcome: Progressing   Problem: Coping: Goal: Level of anxiety will decrease Outcome: Progressing   Problem: Elimination: Goal: Will not experience complications related to bowel motility Outcome: Progressing Goal: Will not experience complications related to urinary retention Outcome: Progressing   Problem: Pain Managment: Goal: General experience of comfort will improve Outcome: Progressing   Problem: Safety: Goal: Ability to remain free from injury will improve Outcome: Progressing   Problem: Skin Integrity: Goal: Risk for impaired skin integrity will decrease Outcome: Progressing   Problem: Education: Goal: Knowledge of condition will improve Outcome: Progressing Goal: Individualized Educational Video(s) Outcome: Progressing   Problem: Activity: Goal: Will verbalize the importance of balancing activity with adequate rest periods Outcome: Progressing Goal: Ability to tolerate increased activity will improve Outcome: Progressing   Problem: Coping: Goal: Ability to identify  and utilize available resources and services will improve Outcome: Progressing   Problem: Life Cycle: Goal: Chance of risk for complications during the postpartum period will decrease Outcome: Progressing   Problem: Role Relationship: Goal: Ability to demonstrate positive interaction with newborn will improve Outcome: Progressing   Problem: Skin Integrity: Goal: Demonstration of wound healing without infection will improve Outcome: Progressing

## 2020-04-28 NOTE — Lactation Note (Signed)
This note was copied from a baby's chart. Lactation Consultation Note  Patient Name: Susan Price [id number] Date: 04/28/2020 Reason for consult: Initial assessment Age:22 hours   Lactation to the room for initial visit. Mother is wanting to exclusive pump and provide formula and EBM to baby via bottle. Encouraged 8 or more feeds/pumps in the first 24 hours and at least 8 good feeds and 8 pumps sessions in every 24 hours after.  Reviewed appropriate diapers for days of life and How to know your baby is getting enough to eat. Reviewed "Understanding Postpartum and Newborn Care" booklet at bedside. LC # left on board, encouraged to call for any assistance with pumping. Reviewed manual pump for home. Mother has ordered a personal pump for home.  Mother has no further questions at this time.   Maternal Data Reason for exclusion: Mother's choice to formula and breast feed on admission (Mother's choidce to feed formula and breastmilk via bottle only) Has patient been taught Hand Expression?:  (discussed hand expression, encouraged to call) Does the patient have breastfeeding experience prior to this delivery?: No  Feeding Bottle feeding, Formula and EBM/pumping  Interventions Interventions: Hand express;DEBP;Hand pump  Lactation Tools Discussed/Used Pump Education: Setup, frequency, and cleaning;Milk Storage Initiated by::  (night shift after delivery) Date initiated:: 04/28/20   Consult Status Consult Status: Follow-up Date: 04/28/20 Follow-up type: Call as needed    Karlisa Gaubert D [place] Sarinana 04/28/2020, 12:46 PM

## 2020-04-29 LAB — RHOGAM INJECTION: Unit division: 0

## 2020-04-29 MED ORDER — IBUPROFEN 600 MG PO TABS: 600.0000 mg | ORAL_TABLET | Freq: Four times a day (QID) | ORAL | 0 refills | Status: DC

## 2020-04-29 MED ORDER — ACETAMINOPHEN 325 MG PO TABS: 650.0000 mg | ORAL_TABLET | ORAL | 0 refills | Status: DC | PRN

## 2020-04-29 MED ORDER — DOCUSATE SODIUM 100 MG PO CAPS: 100.0000 mg | ORAL_CAPSULE | Freq: Two times a day (BID) | ORAL | 2 refills | Status: AC

## 2020-04-29 MED ORDER — POLYETHYLENE GLYCOL 3350 17 G PO PACK: 17.0000 g | PACK | Freq: Every day | ORAL | 0 refills | Status: DC

## 2020-04-29 NOTE — Lactation Note (Signed)
This note was copied from a baby's chart. Lactation Consultation Note  Patient Name: Boy Tyffani Foglesong SJGGE'[id number] Date: 04/29/2020 Reason for consult: Follow-up assessment Age:22 hours  Mother has chosen to feed breast milk and formula via bottle only. She is using the Symphony pump in the room and will take the manual pump home. Discussed her purchasing a pump from online. Encouraged her to read the reviews of the pumps and chose what is best for her. Encouraged to feed baby 8x's in 24hours and the same for pumping. Mother still has handouts on breast milk storage, prep and feeding and the same for formula. No further questions at this time.   Maternal Data Formula Feeding for Exclusion: No Reason for exclusion: Mother's choice to formula feed on admision (She is wanting to feed formula and give her pumped milk) Has patient been taught Hand Expression?: Yes Does the patient have breastfeeding experience prior to this delivery?: No   Interventions Interventions: Hand pump;DEBP;Coconut oil;Hand express  Lactation Tools Discussed/Used     Consult Status Consult Status: PRN Follow-up type: In-patient    Keondrick Dilks D Deontra Pereyra 04/29/2020, 10:49 AM

## 2020-04-29 NOTE — Progress Notes (Signed)
Reviewed D/C instructions with pt and family. Pt verbalized understanding of teaching. Discharged to home via W/C. Pt to schedule f/u appt.  

## 2020-04-29 NOTE — Discharge Instructions (Signed)
Discharge Instructions:   Follow-up Appointment: 1 week, please call the office to schedule  If there are any new medications, they have been ordered and will be available for pickup at the listed pharmacy on your way home from the hospital.   Over the Counter items: Tucks pads (witch hazel) Dibucaine ointment Dermaplast spray Aloe (for padsicles)  Call the office if you have any of the following: headache, visual changes, fever >101.0 F, chills, shortness of breath, breast concerns, excessive vaginal bleeding, incision drainage or problems, leg pain or redness, depression or any other concerns. If you have vaginal discharge with an odor, let your doctor know.   It is normal to bleed for up to 6 weeks. You should not soak through more than 1 pad in 1 hour. If you have a blood clot larger than your fist with continued bleeding, call your doctor.   Activity: Do not lift > 15 lbs for 6 weeks (do not lift anything heavier than your baby). No intercourse, tampons, swimming pools, hot tubs, baths (only showers) for 6 weeks.  No driving for 1-2 weeks. Do not drive while taking narcotic or opioid pain medication.  Continue taking your prenatal vitamin, especially if breastfeeding. Increase calories and fluids (water) while breastfeeding.   Your milk will come in, in the next couple of days (right now it is colostrum). You may have a slight fever when your milk comes in, but it should go away on its own.  If it does not, and rises above 101 F please call the doctor. You will also feel achy and your breasts will be firm. They will also start to leak. If you are breastfeeding, continue as you have been and you can pump/express milk for comfort.   If you have too much milk, your breasts can become engorged, which could lead to mastitis. This is an infection of the milk ducts. It can be very painful and you will need to notify your doctor to obtain a prescription for antibiotics. You can also treat it  with a shower or hot/cold compress.   For concerns about your baby, please call your pediatrician.  For breastfeeding concerns, the lactation consultant can be reached at [phone].   Postpartum blues (feelings of happy one minute and sad another minute) are normal for the first few weeks but if it gets worse let your doctor know.   Congratulations! We enjoyed caring for you and your new bundle of joy!

## 2020-04-29 NOTE — Progress Notes (Signed)
Subjective:  She is feeling well. She reports bleeding has been less. Pain control is adequate. Voiding without difficulty. Tolerating a regular diet. Ambulating well. Has not had a bowel movement.   Objective:   Blood pressure 114/70, pulse 68, temperature 97.6 F (36.4 C), temperature source Oral, resp. rate 18, height 5\' 4"  (1.626 m), weight 76.2 kg, last menstrual period 07/24/2019, SpO2 99 %, unknown if currently breastfeeding.  General: NAD Pulmonary: no increased work of breathing Abdomen: non-distended, non-tender Uterus:  fundus firm at U; lochia normal Extremities: no edema, no erythema, no tenderness, no signs of DVT  Results for orders placed or performed during the hospital encounter of 04/27/20 (from the past 72 hour(s))  Type and screen     Status: None   Collection Time: 04/27/20  3:47 PM  Result Value Ref Range   ABO/RH(D) O NEG    Antibody Screen POS    Sample Expiration 04/30/2020,2359    Antibody Identification      PASSIVELY ACQUIRED ANTI-D Performed at [facility], [address] [place], [place]   CBC     Status: Abnormal   Collection Time: 04/27/20  3:56 PM  Result Value Ref Range   WBC 13.9 (H) 4.0 - 10.5 K/uL   RBC 4.40 3.87 - 5.11 MIL/uL   Hemoglobin 13.4 12.0 - 15.0 g/dL   HCT 04/29/20 52.8 - 41.3 %   MCV 89.1 80.0 - 100.0 fL   MCH 30.5 26.0 - 34.0 pg   MCHC 34.2 30.0 - 36.0 g/dL   RDW 24.4 01.0 - 27.2 %   Platelets 211 150 - 400 K/uL   nRBC 0.0 0.0 - 0.2 %    Comment: Performed at [facility], [address] [place], [place]  RPR     Status: None   Collection Time: 04/27/20  3:56 PM  Result Value Ref Range   RPR Ser Ql NON REACTIVE NON REACTIVE    Comment: Performed at [facility], [address], [place], [place]  Rhogam injection     Status: None (Preliminary result)   Collection Time: 04/28/20  6:26 AM  Result Value Ref Range   Unit Number 04/30/20    Blood Component Type  RHIG    Unit division 00    Status of Unit ISSUED    Transfusion Status      OK TO TRANSFUSE Performed at [facility], [address] [place], [place]   Fetal screen     Status: None   Collection Time: 04/28/20  6:26 AM  Result Value Ref Range   Fetal Screen      NEG Performed at [facility], [address] [place], [place]   CBC     Status: Abnormal   Collection Time: 04/28/20  6:26 AM  Result Value Ref Range   WBC 15.4 (H) 4.0 - 10.5 K/uL   RBC 3.93 3.87 - 5.11 MIL/uL   Hemoglobin 12.3 12.0 - 15.0 g/dL   HCT 04/30/20 (L) 63.0 - 16.0 %   MCV 88.5 80.0 - 100.0 fL   MCH 31.3 26.0 - 34.0 pg   MCHC 35.3 30.0 - 36.0 g/dL   RDW 10.9 32.3 - 55.7 %   Platelets 178 150 - 400 K/uL   nRBC 0.0 0.0 - 0.2 %    Comment: Performed at [facility], [address], [place], [place]    Assessment:   20 y.o. [phone]  postpartum day # 2  Plan:   1) Acute blood loss anemia - hemodynamically stable and asymptomatic - po ferrous sulfate  2) Blood Type --/--/O NEG (01/21 1547)   3) Rubella 12.70 (06/30 1030) / Varicella Immune  4) TDAP status - up todate Immunization History  Administered Date(s) Administered  . Tdap 02/28/2020    5) Feeding- breast/bottle   6) Contraception - desires OCP postpartum  7) Disposition - home today  Adelene Idler MD [facility], [facility] 04/29/2020 10:26 AM

## 2020-05-01 ENCOUNTER — Encounter: Payer: Medicaid Other | Admitting: Obstetrics and Gynecology

## 2020-08-28 ENCOUNTER — Encounter: Payer: Self-pay | Admitting: Obstetrics and Gynecology

## 2020-08-29 ENCOUNTER — Encounter: Payer: Self-pay | Admitting: Obstetrics and Gynecology

## 2021-09-19 IMAGING — US US PELVIS LIMITED
1 series · 4 of 4 positions shown · non-contrast
Comparison: None.

CLINICAL DATA: Small palpable mass along the right side of the
perineum

EXAM:
LIMITED ULTRASOUND OF PELVIS
TECHNIQUE: Limited transabdominal ultrasound examination of the pelvis was
performed.

[Series 1: us pelvis limited · 0.07mm/px · 4 of 4 slices shown]
[im 1/4]
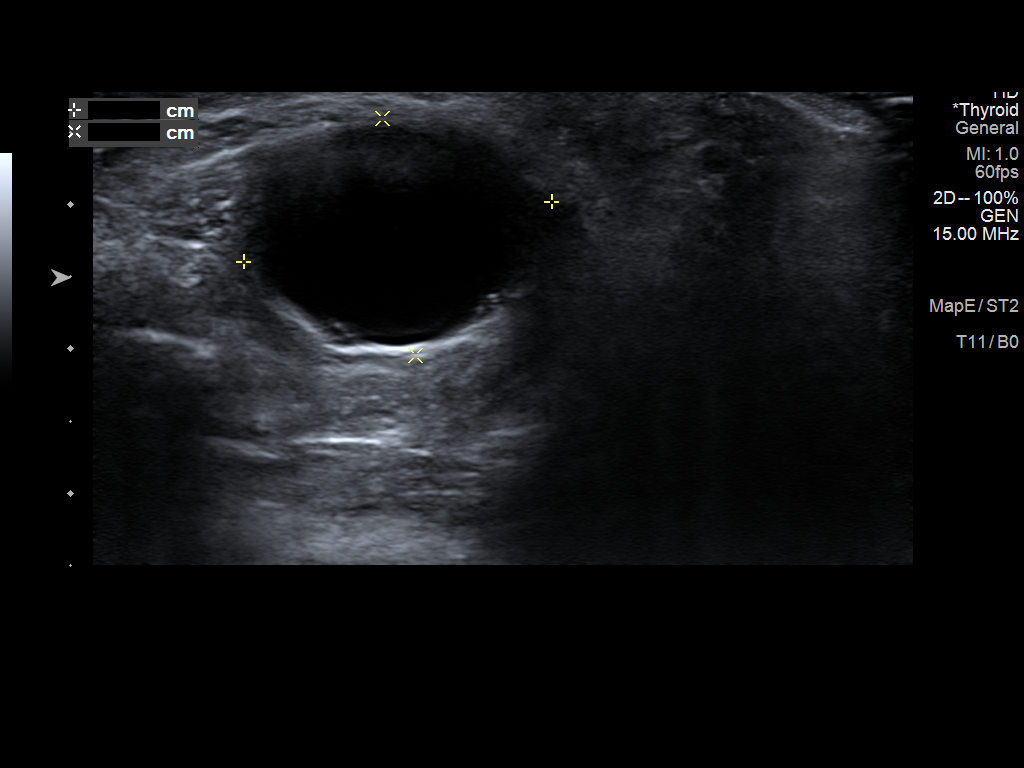
[im 2/4]
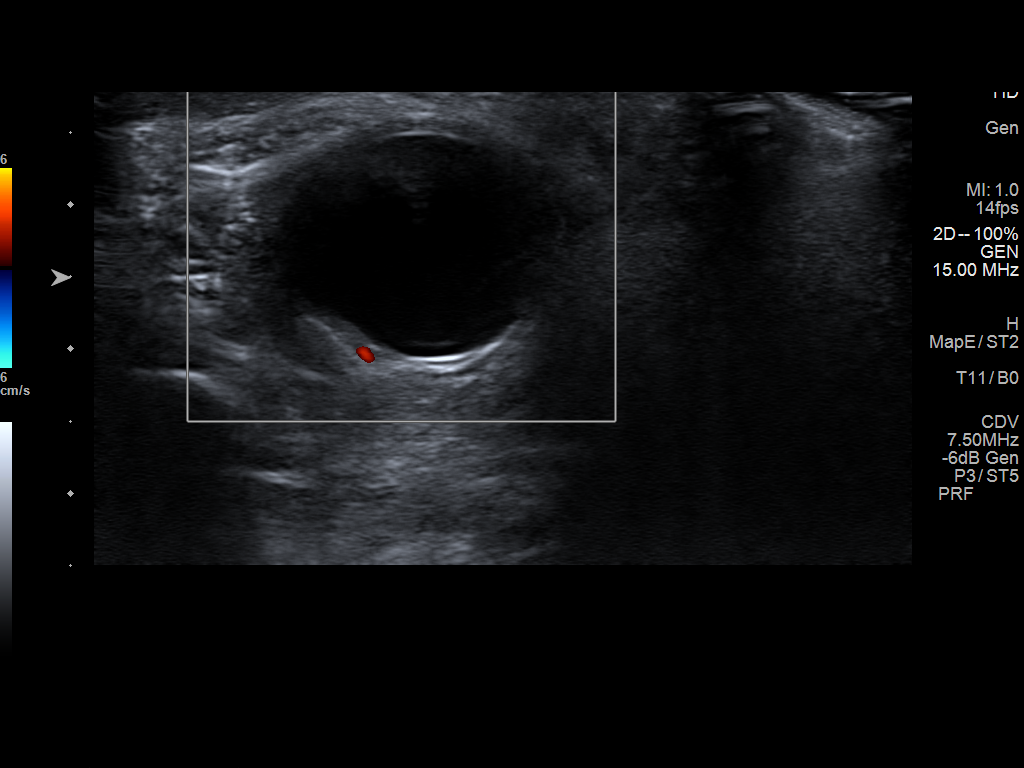
[im 3/4]
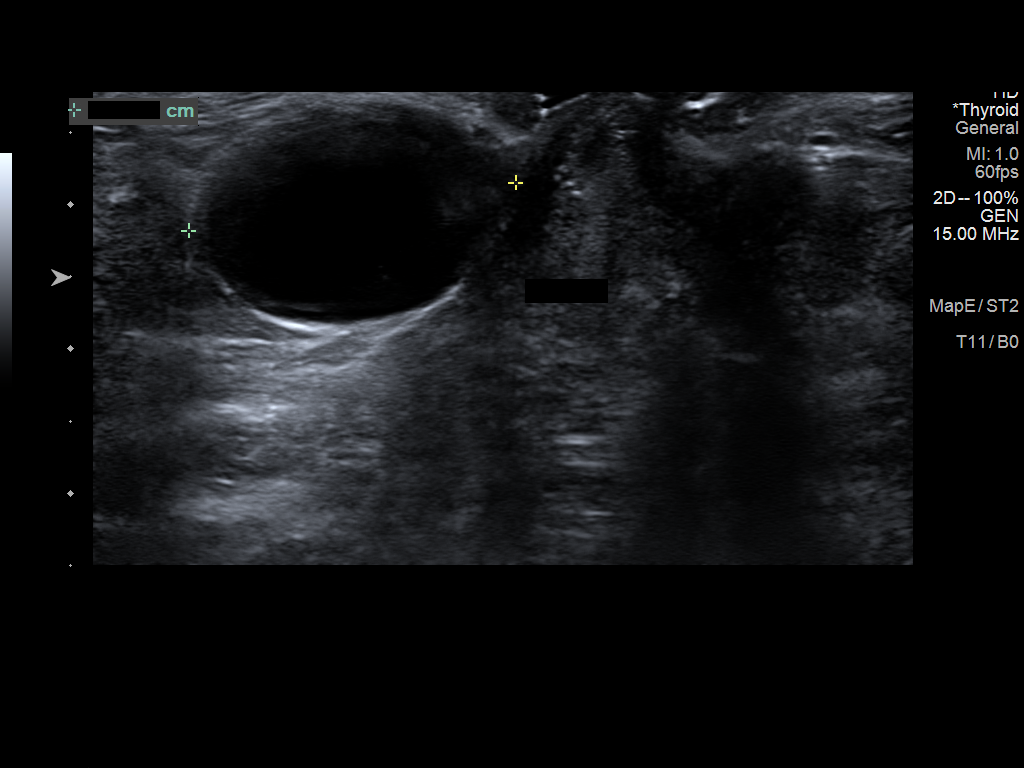
[im 4/4]
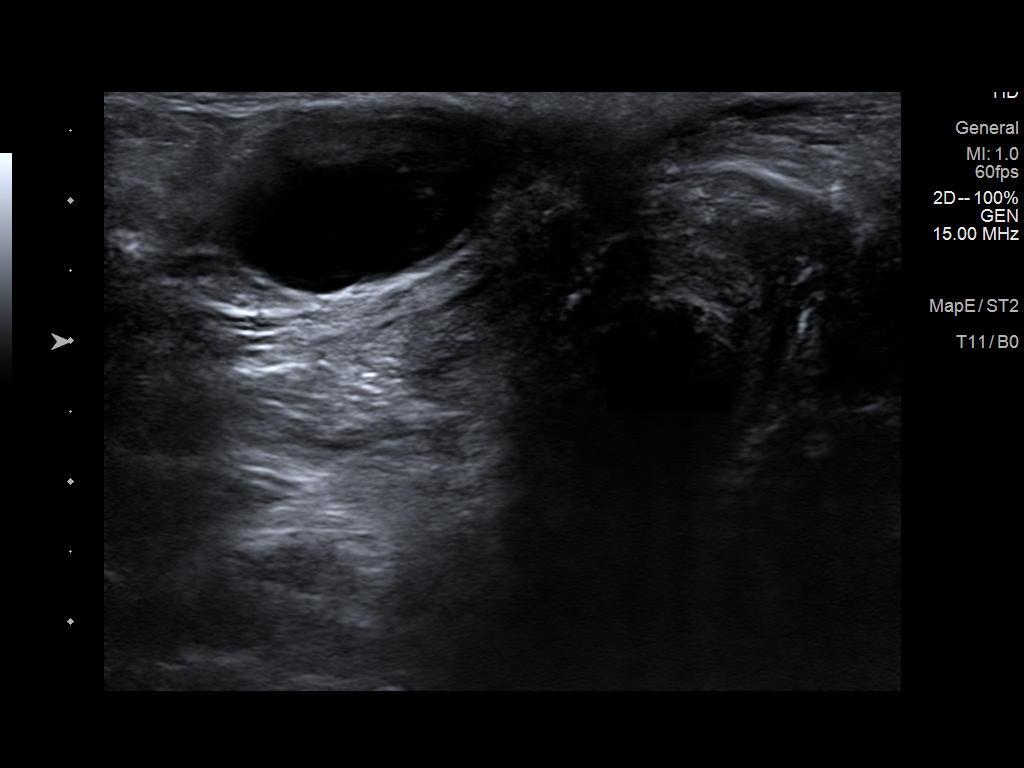

[4 of 4 positions shown; findings below may reference images not displayed]

FINDINGS: Real-time sonography of the right peroneal region was performed with
a high-frequency linear transducer.

2.2 x 2.3 x 1.7 cm anechoic mass with increased through transmission
in the perineal fat separate from the rectum and just lateral to the
labia. No internal Doppler flow.

No other solid or cystic mass.
IMPRESSION: 1. 2.2 x 2.3 x 1.7 cm right perineal cyst.

## 2022-01-20 IMAGING — US US OB < 14 WEEKS - US OB TV
1 series · 13 of 28 positions shown · non-contrast
Comparison: No applicable comparisons.

CLINICAL DATA: Vaginal bleeding in the first trimester

EXAM:
OBSTETRIC <14 WK US AND TRANSVAGINAL OB US
TECHNIQUE: Both transabdominal and transvaginal ultrasound examinations were
performed for complete evaluation of the gestation as well as the
maternal uterus, adnexal regions, and pelvic cul-de-sac.
Transvaginal technique was performed to assess early pregnancy.

[Series 1: ob us · 13 of 75 slices shown]
[im 3/75]
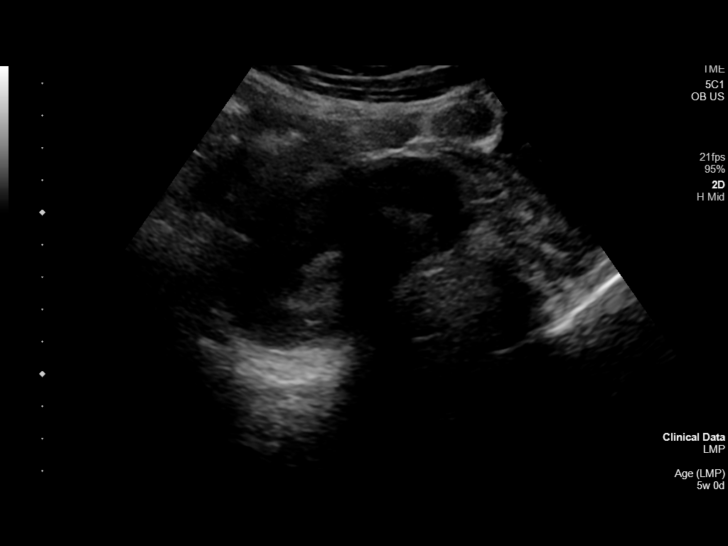
[im 9/75]
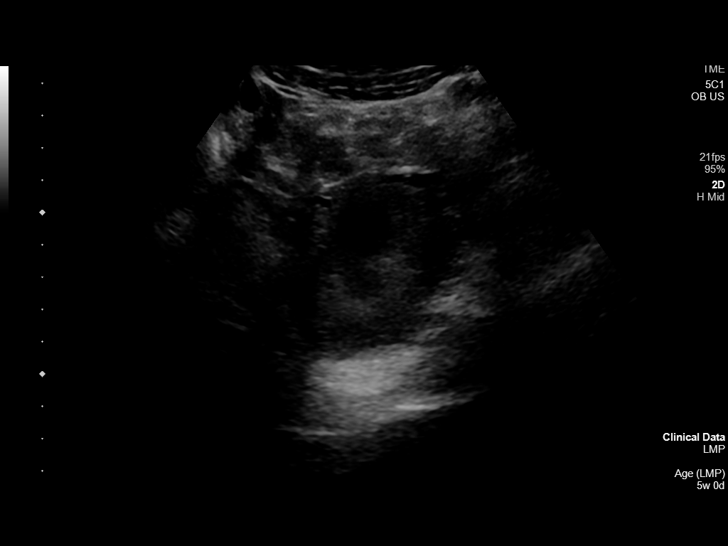
[im 14/75]
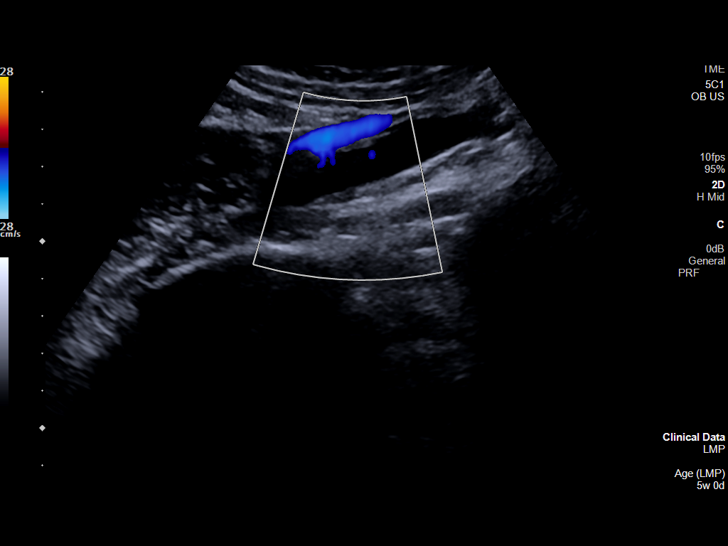
[im 20/75]
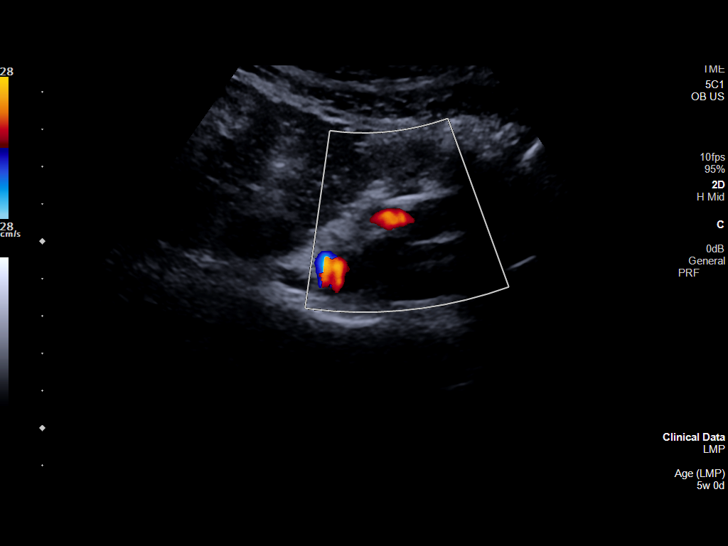
[im 25/75]
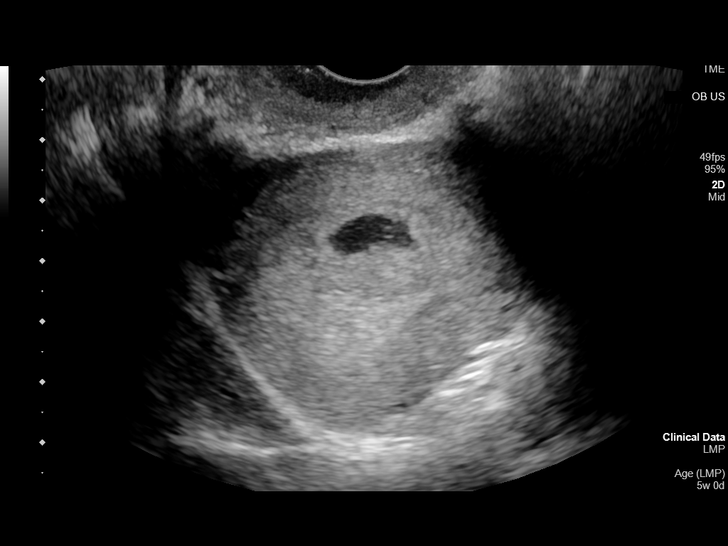
[im 31/75]
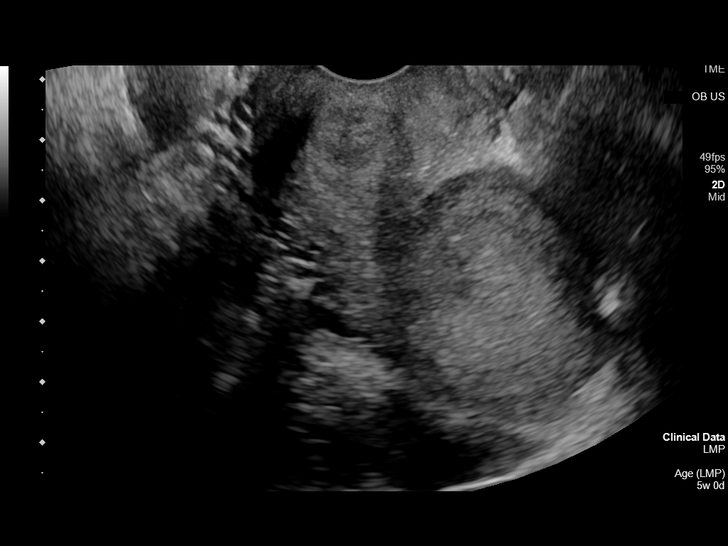
[im 39/75]
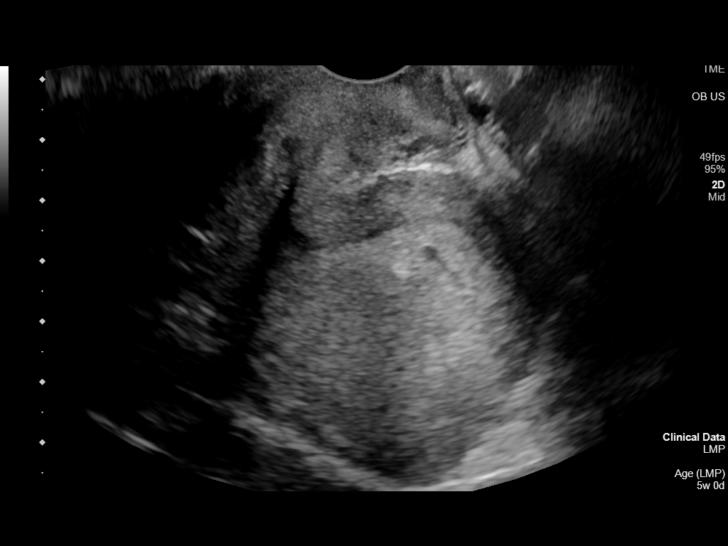
[im 44/75]
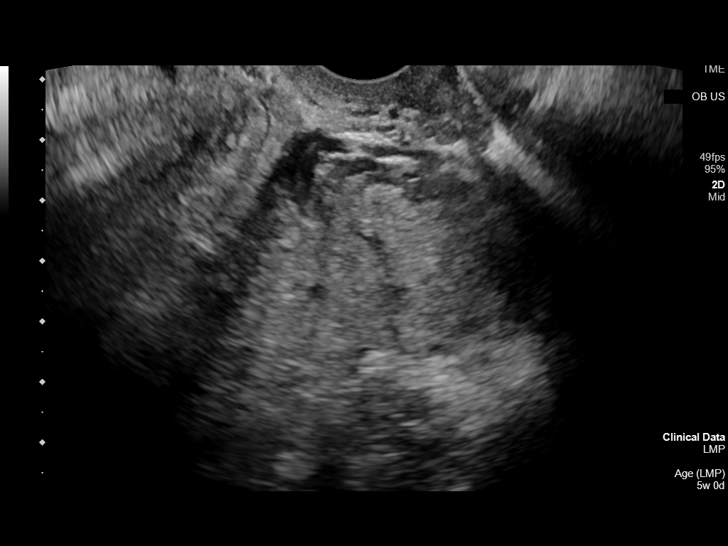
[im 50/75]
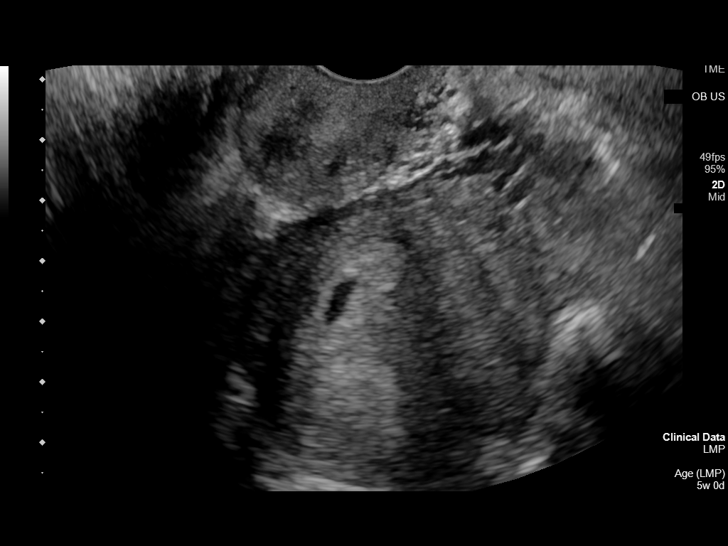
[im 55/75]
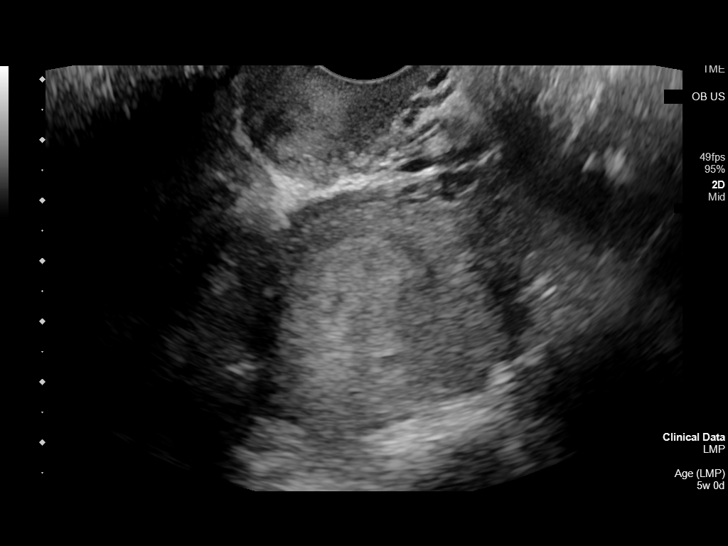
[im 61/75]
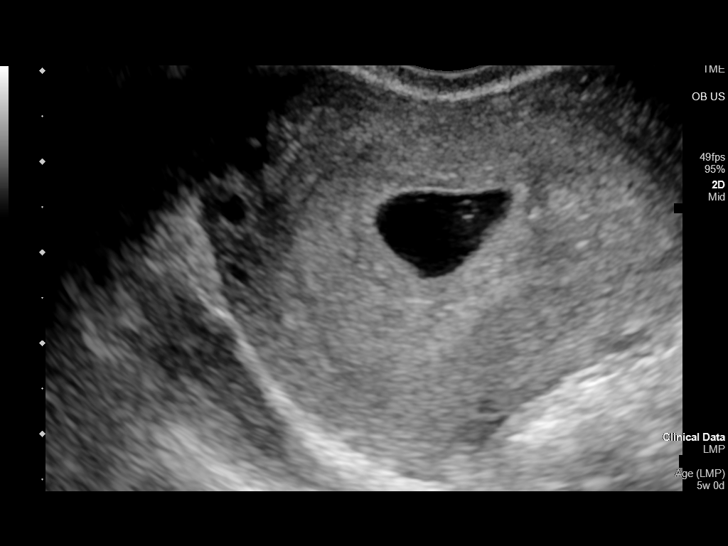
[im 66/75]
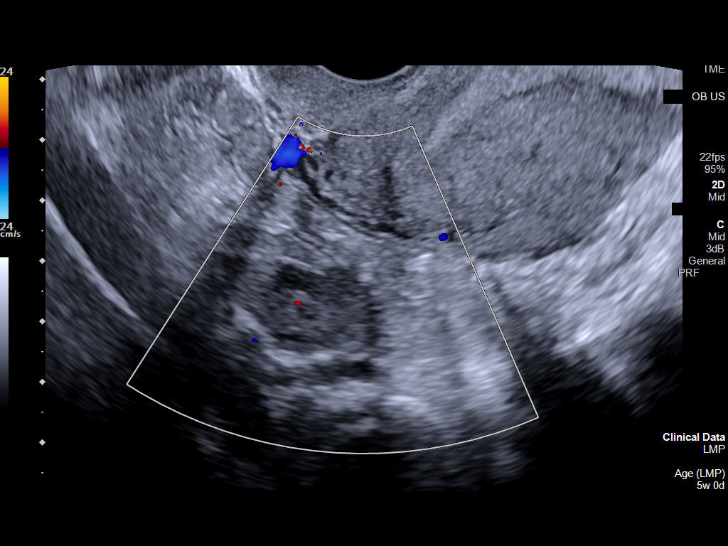
[im 72/75]
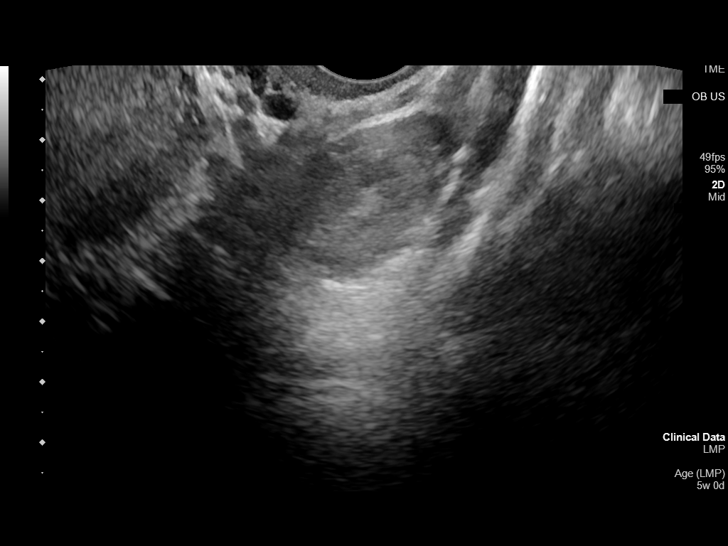

[13 of 28 positions shown; findings below may reference images not displayed]

FINDINGS: LMP: 07/24/2019

GA by LMP: 5 w  0 d

EDC by LMP: 04/29/2020

Intrauterine gestational sac: Single

Yolk sac:  Visualized

Embryo:  Not visualized

Cardiac Activity: Not visualized

MSD: 9.4 mm   5 w   5 d

Subchorionic hemorrhage:  None visualized.

Maternal uterus/adnexae: Retroflexed uterus. Slightly ill-defined
cystic structure in the left adnexa with peripheral color flow may
reflect a corpus luteum. Left ovary measures 3.3 x 2.1 x 2.7 cm.
Right ovary measures 2.6 x 1.3 x 2.0 cm. No concerning right adnexal
lesions. No free fluid.
IMPRESSION: Probable early intrauterine gestational sac, but no fetal pole, or
cardiac activity yet visualized. Recommend follow-up quantitative
B-HCG levels and follow-up US in 14 days to assess viability. This
recommendation follows SRU consensus guidelines: Diagnostic Criteria
for Nonviable Pregnancy Early in the First Trimester. N Engl J Med
1832; [DATE].

Retroflexed uterus.

Probable corpus luteum cyst in the left ovary, recommend continued
attention on follow-up.

## 2022-02-25 IMAGING — US US OB COMP LESS 14 WK
1 series · 15 of 28 positions shown · non-contrast
Comparison: 09/29/2019

CLINICAL DATA: Abdominal cramping, spotting

EXAM:
OBSTETRIC <14 WK ULTRASOUND
TECHNIQUE: Transabdominal ultrasound was performed for evaluation of the
gestation as well as the maternal uterus and adnexal regions.

[Series 1: us ob comp less 14 wk · 15 of 29 slices shown]
[im 1/29]
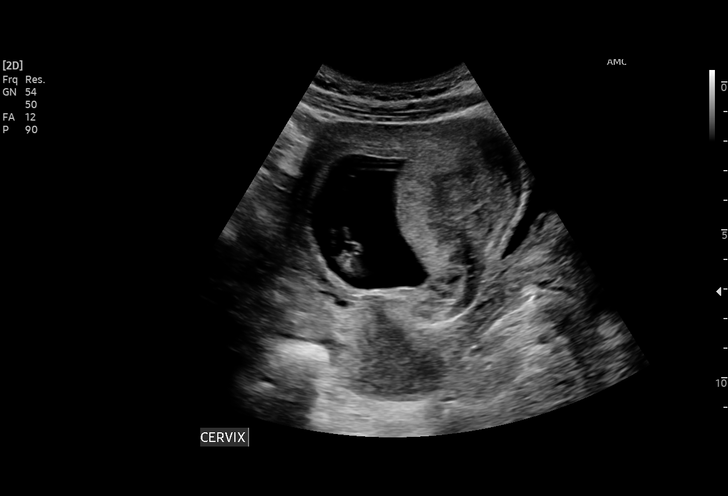
[im 3/29]
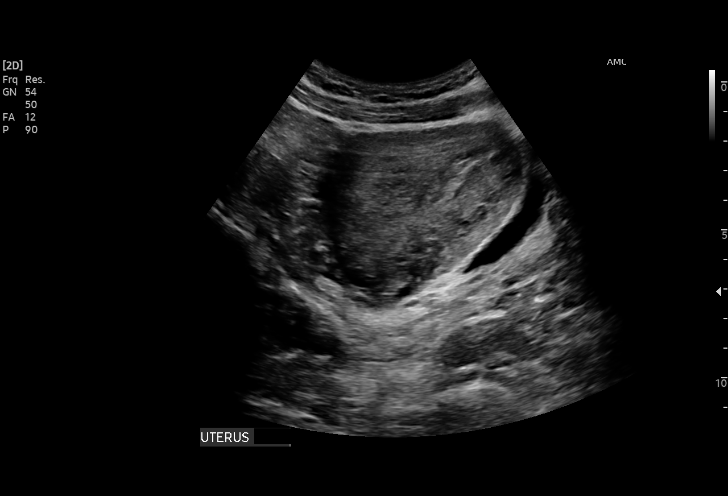
[im 5/29]
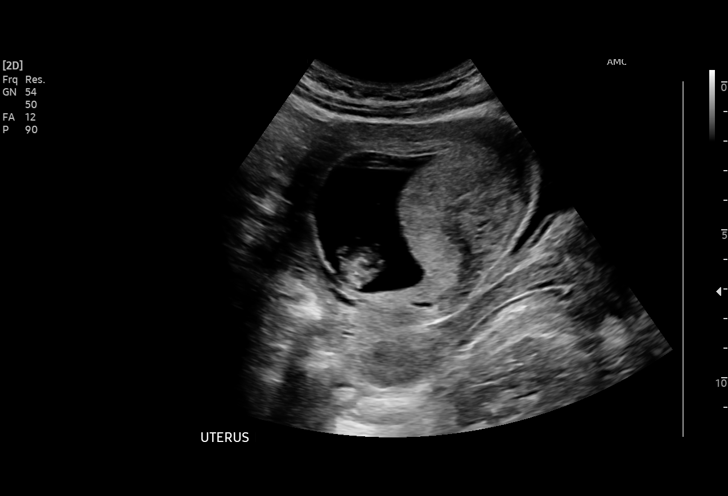
[im 7/29]
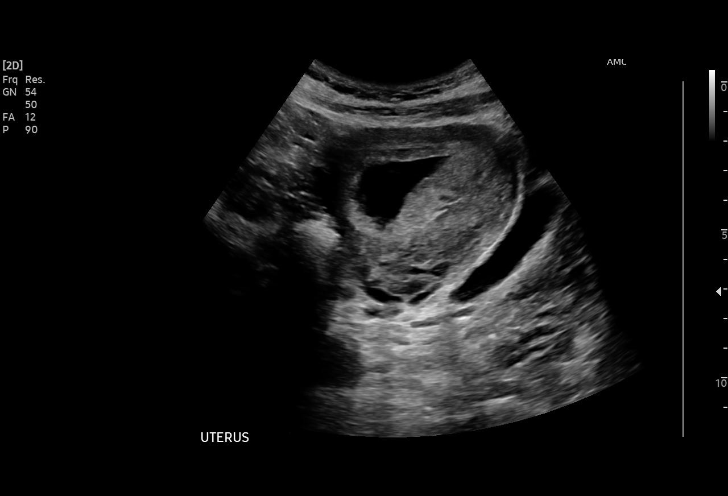
[im 9/29]
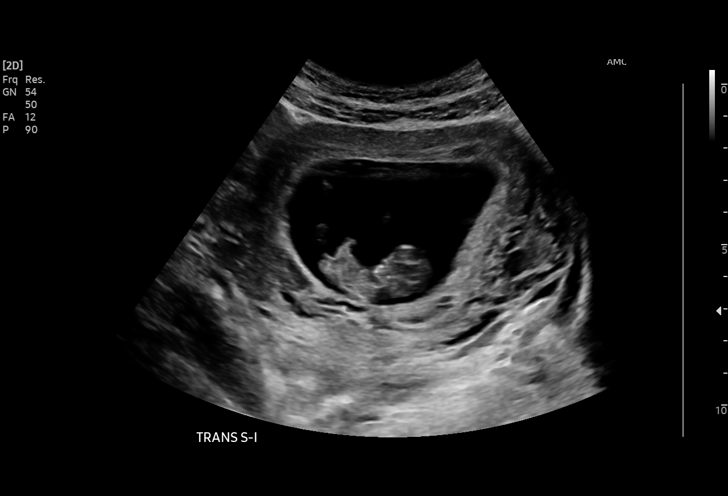
[im 11/29]
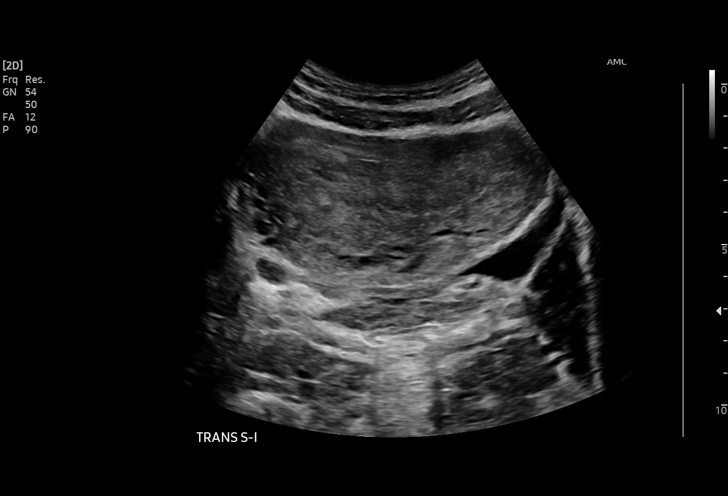
[im 13/29]
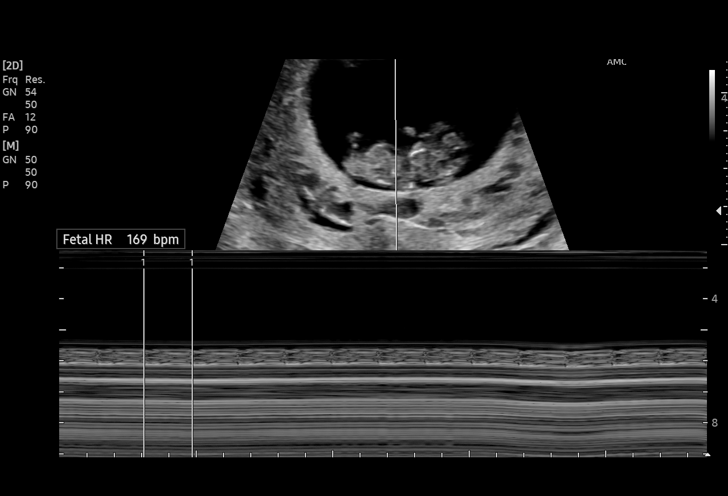
[im 15/29]
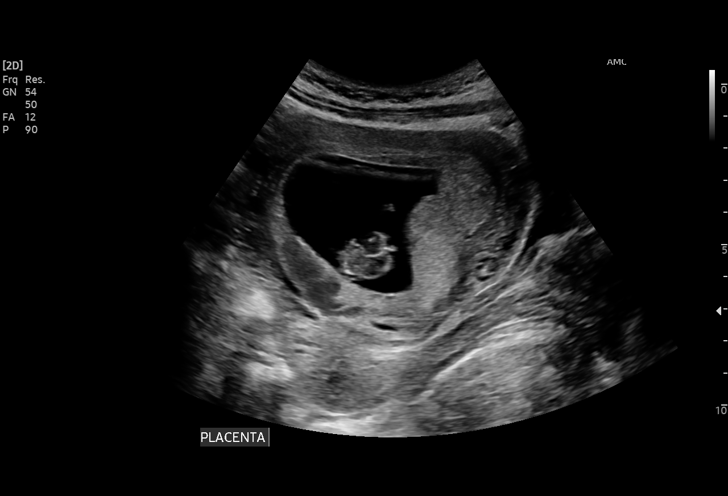
[im 16/29]
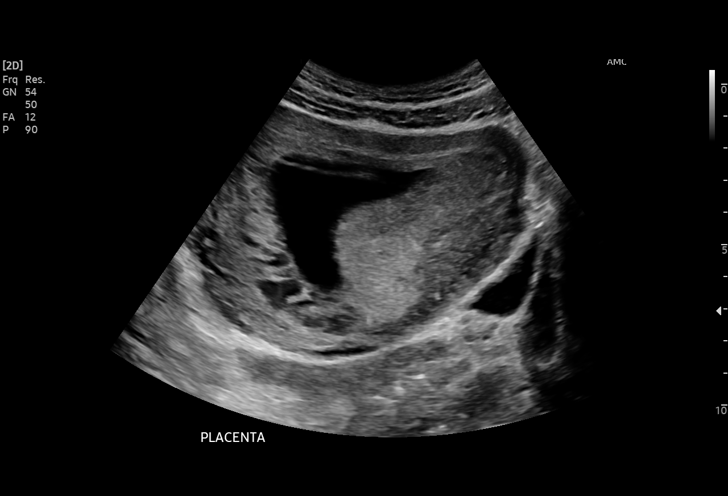
[im 18/29]
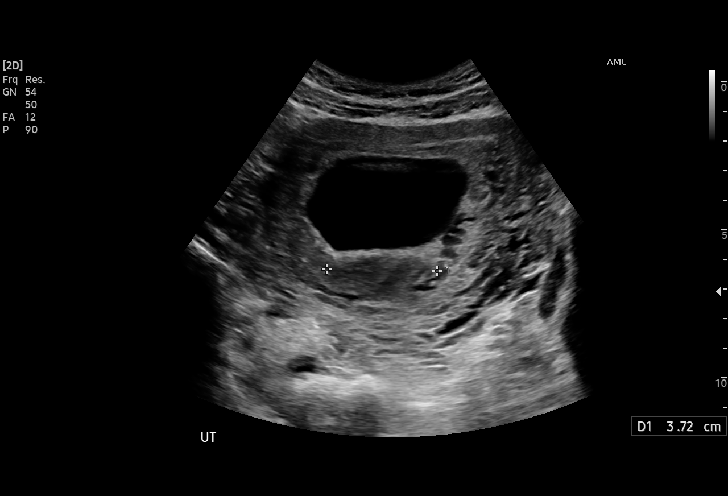
[im 20/29]
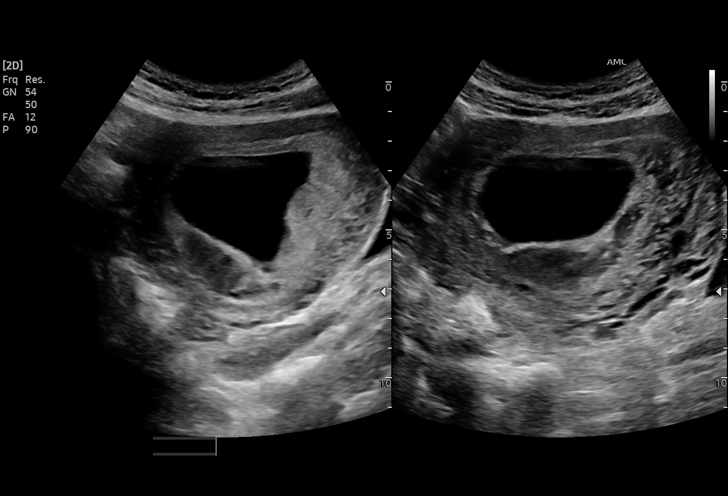
[im 22/29]
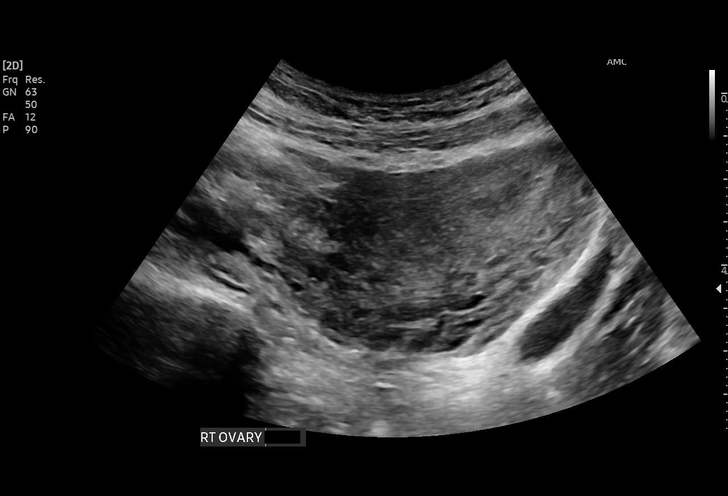
[im 24/29]
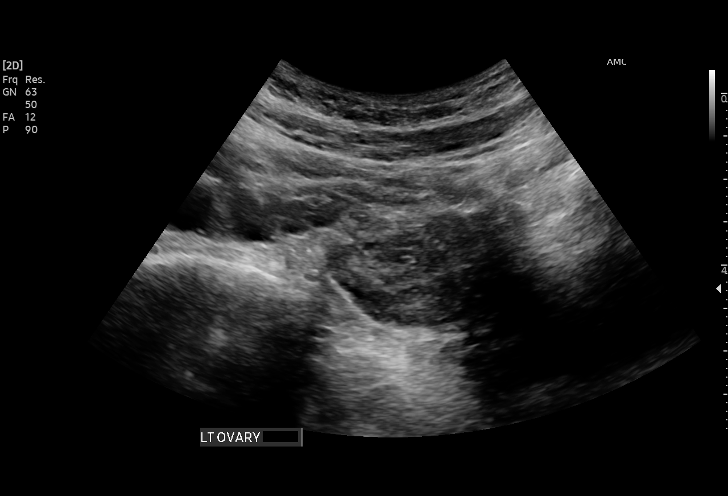
[im 26/29]
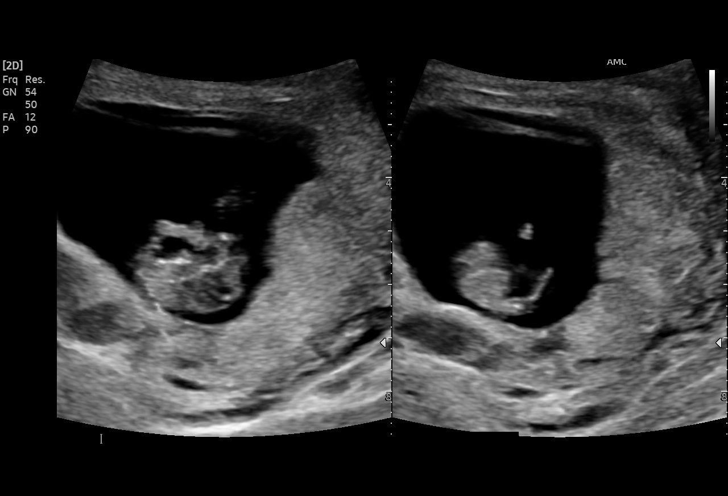
[im 29/29]
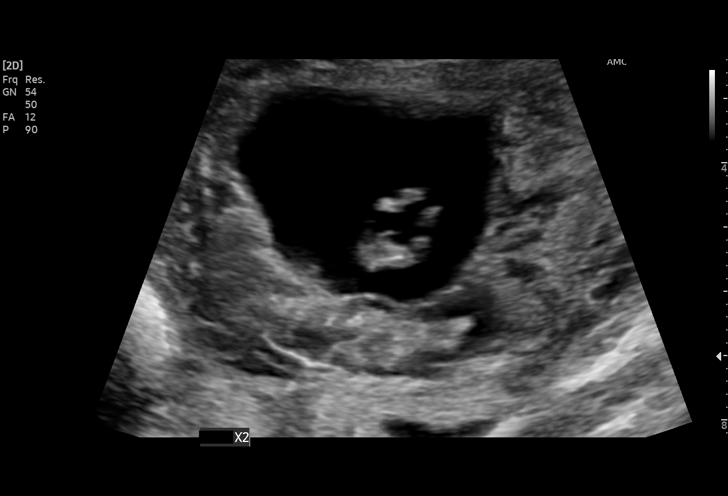

[15 of 28 positions shown; findings below may reference images not displayed]

FINDINGS: Intrauterine gestational sac: Single

Yolk sac:  Visualized.

Embryo:  Visualized.

Cardiac Activity: Visualized.

Heart Rate: 169 bpm

CRL:   33.6 mm   10 w 2 d                  US EDC: 04/28/2020

Subchorionic hemorrhage: On the left superior aspect of the
gestational sac there is a 3.7 x 4.3 x 1.4 cm subchorionic
hemorrhage.

Maternal uterus/adnexae: No adnexal masses.  No free fluid.
IMPRESSION: 1. Single live intrauterine pregnancy as above, estimated age 10
weeks and 2 days.
2. 4.3 cm subchorionic hemorrhage.

## 2022-08-06 NOTE — Telephone Encounter (Signed)
Error

## 2022-09-08 ENCOUNTER — Ambulatory Visit: Payer: Medicaid Other | Admitting: Nurse Practitioner

## 2022-09-29 ENCOUNTER — Ambulatory Visit: Payer: Medicaid Other | Admitting: Internal Medicine

## 2022-09-29 ENCOUNTER — Encounter: Payer: Self-pay | Admitting: Internal Medicine

## 2022-09-29 VITALS — BP 103/60 | HR 92 | Ht 64.0 in | Wt 117.2 lb

## 2022-09-29 DIAGNOSIS — R5383 Other fatigue: Secondary | ICD-10-CM

## 2022-09-29 DIAGNOSIS — N750 Cyst of Bartholin's gland: Secondary | ICD-10-CM | POA: Diagnosis not present

## 2022-09-29 DIAGNOSIS — R41840 Attention and concentration deficit: Secondary | ICD-10-CM | POA: Diagnosis not present

## 2022-09-29 DIAGNOSIS — R233 Spontaneous ecchymoses: Secondary | ICD-10-CM

## 2022-09-29 MED ORDER — QUETIAPINE FUMARATE 25 MG PO TABS: 25.0000 mg | ORAL_TABLET | Freq: Every day | ORAL | 0 refills | Status: DC

## 2022-09-29 MED ORDER — QUETIAPINE FUMARATE 25 MG PO TABS
25.0000 mg | ORAL_TABLET | Freq: Every day | ORAL | 0 refills | Status: DC
Start: 2022-09-29 — End: 2023-03-02

## 2022-09-29 NOTE — Progress Notes (Signed)
Established Patient Office Visit  Subjective:  Patient ID: Susan Price, female    DOB: Jul 31, 2000  Age: 22 y.o. MRN: [id number]  Chief Complaint  Patient presents with   Follow-up    Follow up    Patient in office with multiple complaints. Patient reports she has been bruising easily. Reports she develops bruise with minimal pressure. Was anemic during pregnancy. Will check labs for anemia, clotting disorder. Patient reports Susan Price cyst on right labia. Has a history of Susan Price cysts that have resolved on their own, the current one is not. Recommend heat compress. May need to call her ob/gyn. Patient also complaining of trouble focusing, fatigue and anxiety . Patient was on ADHD medicine as a child, and multiple anxiolytics and SSRIs. Thinks nothing really worked, but also did not stay on them adequately. Patient is now going back to college, is having trouble focusing. Feels fatigued after a restful sleep. Will trial Seroquel 25 mg nightly. If a positive response, will consider increasing dose at next visit.     No other concerns at this time.   Past Medical History:  Diagnosis Date   Depression     Past Surgical History:  Procedure Laterality Date   TONSILLECTOMY AND ADENOIDECTOMY      Social History   Socioeconomic History   Marital status: Single    Spouse name: Not on file   Number of children: Not on file   Years of education: Not on file   Highest education level: Not on file  Occupational History   Not on file  Tobacco Use   Smoking status: Former    Types: Cigarettes   Smokeless tobacco: Never  Vaping Use   Vaping Use: Former  Substance and Sexual Activity   Alcohol use: No   Drug use: Not Currently    Types: Marijuana    Comment: a couple times during the beggingin of pregnancy]   Sexual activity: Yes    Partners: Male    Birth control/protection: None  Other Topics Concern   Not on file  Social History Narrative   Not on file   Social  Determinants of Health   Financial Resource Strain: Not on file  Food Insecurity: Not on file  Transportation Needs: Not on file  Physical Activity: Not on file  Stress: Not on file  Social Connections: Not on file  Intimate Partner Violence: Not on file    Family History  Problem Relation Age of Onset   Anxiety disorder Mother     No Known Allergies  Review of Systems  Constitutional:  Positive for malaise/fatigue. Negative for chills and fever.  HENT: Negative.  Negative for hearing loss and tinnitus.   Eyes: Negative.  Negative for blurred vision.  Respiratory: Negative.  Negative for cough, shortness of breath and wheezing.   Cardiovascular: Negative.  Negative for chest pain and palpitations.  Gastrointestinal: Negative.  Negative for abdominal pain, constipation, diarrhea, heartburn, nausea and vomiting.  Genitourinary: Negative.  Negative for dysuria, frequency and urgency.  Musculoskeletal: Negative.  Negative for back pain, falls, joint pain, myalgias and neck pain.  Skin: Negative.   Neurological: Negative.  Negative for dizziness, tingling, tremors, weakness and headaches.  Endo/Heme/Allergies: Negative.   Psychiatric/Behavioral:  Negative for depression and memory loss. The patient is nervous/anxious. The patient does not have insomnia.        Objective:   BP 103/60   Pulse 92   Ht 5\' 4"  (1.626 m)   Wt 117 lb 3.2  oz (53.2 kg)   SpO2 99%   BMI 20.12 kg/m   Vitals:   09/29/22 1432  BP: 103/60  Pulse: 92  Height: 5\' 4"  (1.626 m)  Weight: 117 lb 3.2 oz (53.2 kg)  SpO2: 99%  BMI (Calculated): 20.11    Physical Exam Vitals and nursing note reviewed.  Constitutional:      Appearance: Normal appearance.  HENT:     Head: Normocephalic and atraumatic.     Nose: Nose normal.  Eyes:     Conjunctiva/sclera: Conjunctivae normal.  Cardiovascular:     Rate and Rhythm: Normal rate and regular rhythm.     Heart sounds: Normal heart sounds.  Pulmonary:      Effort: Pulmonary effort is normal.     Breath sounds: Normal breath sounds. No wheezing, rhonchi or rales.  Abdominal:     General: Bowel sounds are normal. There is no distension.     Palpations: Abdomen is soft. There is no mass.     Tenderness: There is no guarding.  Genitourinary:   Musculoskeletal:        General: No signs of injury. Normal range of motion.     Cervical back: Normal range of motion.     Right lower leg: No edema.     Left lower leg: No edema.  Skin:    General: Skin is warm and dry.  Neurological:     General: No focal deficit present.     Mental Status: She is alert and oriented to person, place, and time.  Psychiatric:        Mood and Affect: Mood normal.        Behavior: Behavior normal.      No results found for any visits on 09/29/22.  No results found for this or any previous visit (from the past 2160 hour(s)).    Assessment & Plan:  Will check labs for easy bruising. Trial of Seroquel for fatigue and trouble focusing.  May need psychiatrist referral.  Problem List Items Addressed This Visit     Easy bruising - Primary   Relevant Orders   CBC With Differential   APTT   Protime-INR   CMP14+EGFR   Susan Price cyst   Fatigue   Attention and concentration deficit    Return in about 2 weeks (around 10/13/2022).   Total time spent: 30 minutes  Margaretann Loveless, MD  09/29/2022   This document may have been prepared by [facility] Voice Recognition software and as such may include unintentional dictation errors.

## 2022-09-30 LAB — CMP14+EGFR
ALT: 19 IU/L (ref 0–32)
AST: 22 IU/L (ref 0–40)
Albumin: 4.5 g/dL (ref 4.0–5.0)
Alkaline Phosphatase: 87 IU/L (ref 44–121)
BUN/Creatinine Ratio: 14 (ref 9–23)
BUN: 10 mg/dL (ref 6–20)
Bilirubin Total: 0.5 mg/dL (ref 0.0–1.2)
CO2: 24 mmol/L (ref 20–29)
Calcium: 9.6 mg/dL (ref 8.7–10.2)
Chloride: 103 mmol/L (ref 96–106)
Creatinine, Ser: 0.72 mg/dL (ref 0.57–1.00)
Globulin, Total: 2.4 g/dL (ref 1.5–4.5)
Glucose: 90 mg/dL (ref 70–99)
Potassium: 3.9 mmol/L (ref 3.5–5.2)
Sodium: 140 mmol/L (ref 134–144)
Total Protein: 6.9 g/dL (ref 6.0–8.5)
eGFR: 120 mL/min/{1.73_m2} (ref >59)

## 2022-09-30 LAB — CBC WITH DIFFERENTIAL
Basophils Absolute: 0 10*3/uL (ref 0.0–0.2)
Basos: 0 %
EOS (ABSOLUTE): 0.1 10*3/uL (ref 0.0–0.4)
Eos: 1 %
Hematocrit: 39.6 % (ref 34.0–46.6)
Hemoglobin: 13 g/dL (ref 11.1–15.9)
Immature Grans (Abs): 0 10*3/uL (ref 0.0–0.1)
Immature Granulocytes: 0 %
Lymphocytes Absolute: 2 10*3/uL (ref 0.7–3.1)
Lymphs: 36 %
MCH: 29.1 pg (ref 26.6–33.0)
MCHC: 32.8 g/dL (ref 31.5–35.7)
MCV: 89 fL (ref 79–97)
Monocytes Absolute: 0.5 10*3/uL (ref 0.1–0.9)
Monocytes: 9 %
Neutrophils Absolute: 3 10*3/uL (ref 1.4–7.0)
Neutrophils: 54 %
RBC: 4.47 x10E6/uL (ref 3.77–5.28)
RDW: 14.4 % (ref 11.7–15.4)
WBC: 5.6 10*3/uL (ref 3.4–10.8)

## 2022-10-01 NOTE — Progress Notes (Signed)
Spoke with patient, verbalized understanding

## 2022-10-13 ENCOUNTER — Ambulatory Visit: Payer: Medicaid Other | Admitting: Internal Medicine

## 2022-10-27 NOTE — Progress Notes (Signed)
Error encounter. 

## 2022-11-21 ENCOUNTER — Ambulatory Visit: Payer: Medicaid Other | Admitting: Obstetrics

## 2022-11-21 DIAGNOSIS — Z01419 Encounter for gynecological examination (general) (routine) without abnormal findings: Secondary | ICD-10-CM

## 2022-12-02 ENCOUNTER — Ambulatory Visit: Payer: Medicaid Other | Admitting: Obstetrics

## 2023-01-01 ENCOUNTER — Ambulatory Visit (INDEPENDENT_AMBULATORY_CARE_PROVIDER_SITE_OTHER): Payer: Medicaid Other

## 2023-01-01 VITALS — BP 101/66 | HR 76 | Wt 117.9 lb

## 2023-01-01 DIAGNOSIS — Z113 Encounter for screening for infections with a predominantly sexual mode of transmission: Secondary | ICD-10-CM

## 2023-01-01 DIAGNOSIS — Z124 Encounter for screening for malignant neoplasm of cervix: Secondary | ICD-10-CM

## 2023-01-01 DIAGNOSIS — N760 Acute vaginitis: Secondary | ICD-10-CM | POA: Diagnosis not present

## 2023-01-01 MED ORDER — METRONIDAZOLE 0.75 % VA GEL
VAGINAL | 6 refills | Status: DC
Start: 2023-01-01 — End: 2023-03-02

## 2023-01-01 NOTE — Progress Notes (Signed)
   GYN ENCOUNTER  Encounter for recurrent BV  Subjective  HPI: Susan Price is a 22 y.o. G1P1001 who presents today for recurrent BV.   With son's father for 5 years and broke up in last 6-8 months, has been with new female partner who has multiple sex partners. Prior to this she had never had issues with BV. First episode of BV was about a month ago after using a menstrual cup. Was initially treated with oral Flagyl but vomited after drinking alcohol. Finished the rest of prescription. Was treated a second time with Flagyl gel which cleared up symptoms until this past week.   Went to Urgent Care over the weekend where they confirmed BV and provided a prescription for Flagyl gel which she has been using.   Past Medical History:  Diagnosis Date   Depression    Past Surgical History:  Procedure Laterality Date   TONSILLECTOMY AND ADENOIDECTOMY     OB History     Gravida  1   Para  1   Term  1   Preterm      AB      Living  1      SAB      IAB      Ectopic      Multiple  0   Live Births  1          No Known Allergies  Review of Systems  12 point ROS negative except for pertinent positives noted in HPO above.   Objective  BP 101/66   Pulse 76   Wt 117 lb 14.4 oz (53.5 kg)   LMP 12/29/2022 (Exact Date)   Breastfeeding No   BMI 20.24 kg/m   Physical examination GENERAL APPEARANCE: alert, well appearing LUNGS: normal work of breathing HEART: normal heart rate PELVIC: normal external female genitalia, mild erythema around vulva, cervix normal in appearance, clots and dark read blood present in vaginal vault.   Assessment/ Plan - Discussed options for managing recurrent BV including use of metronidazole gel twice weekly for 4-6 months or use of boric acid suppository nightly for 30 days. She desires to try the metronidazole gel. Rx provided. - STI screening today with pap smear. - Will follow up as needed.   Lindalou Hose Yovanni Frenette, CNM  01/01/23 11:59  AM

## 2023-01-02 LAB — HEP, RPR, HIV PANEL
HIV Screen 4th Generation wRfx: NONREACTIVE
Hepatitis B Surface Ag: NEGATIVE
RPR Ser Ql: NONREACTIVE

## 2023-01-06 LAB — CYTOLOGY - PAP
Adequacy: ABNORMAL
Chlamydia: NEGATIVE
Comment: NEGATIVE
Comment: NORMAL
Neisseria Gonorrhea: NEGATIVE

## 2023-01-22 ENCOUNTER — Ambulatory Visit: Payer: Medicaid Other | Admitting: Certified Nurse Midwife

## 2023-02-10 ENCOUNTER — Other Ambulatory Visit (HOSPITAL_COMMUNITY)
Admission: RE | Admit: 2023-02-10 | Discharge: 2023-02-10 | Disposition: A | Discharge status: Home / Self Care | Payer: Medicaid Other | Source: Ambulatory Visit

## 2023-02-10 ENCOUNTER — Ambulatory Visit (INDEPENDENT_AMBULATORY_CARE_PROVIDER_SITE_OTHER): Payer: Medicaid Other

## 2023-02-10 VITALS — BP 108/74 | HR 73 | Wt 117.4 lb

## 2023-02-10 DIAGNOSIS — B9689 Other specified bacterial agents as the cause of diseases classified elsewhere: Secondary | ICD-10-CM | POA: Insufficient documentation

## 2023-02-10 DIAGNOSIS — N760 Acute vaginitis: Secondary | ICD-10-CM | POA: Insufficient documentation

## 2023-02-10 DIAGNOSIS — Z124 Encounter for screening for malignant neoplasm of cervix: Secondary | ICD-10-CM

## 2023-02-10 DIAGNOSIS — B379 Candidiasis, unspecified: Secondary | ICD-10-CM

## 2023-02-10 MED ORDER — FLUCONAZOLE 150 MG PO TABS
150.0000 mg | ORAL_TABLET | Freq: Once | ORAL | 3 refills | Status: AC
Start: 2023-02-10 — End: 2023-02-10

## 2023-02-10 NOTE — Progress Notes (Signed)
   GYN ENCOUNTER  Encounter for recurrent BV  Subjective  HPI: Susan Price is a 22 y.o. G1P1001 who presents today for Continues to have outbreaks of BV, particularly with intercourse with female partner. Has been using sex toys with this partner and is concerned about the cleaning and if it might be the cause of BV.  Was initially using metronidazole gel for BV suppression but it was causing severe yeast infections so she stopped.   Past Medical History:  Diagnosis Date   Depression    Past Surgical History:  Procedure Laterality Date   TONSILLECTOMY AND ADENOIDECTOMY     OB History     Gravida  1   Para  1   Term  1   Preterm      AB      Living  1      SAB      IAB      Ectopic      Multiple  0   Live Births  1          No Known Allergies  Review of Systems  12 point ROS negative except for pertinent positives noted in HPO above.   Objective  BP 108/74   Pulse 73   Wt 117 lb 6.4 oz (53.3 kg)   LMP  (Within Weeks)   BMI 20.15 kg/m   Physical examination GENERAL APPEARANCE: alert, well appearing LUNGS: normal work of breathing HEART: normal heart rate Pelvic exam: normal external genitalia, vulva, vagina, cervix, uterus and adnexa, VAGINA: vaginal erythema , vaginal discharge - white, copious, creamy, and curd-like, CERVIX: normal appearing cervix without discharge or lesions.    Assessment/Plan - Pap smear repeated today as sample obtained at last visit was unable to be analyzed. Repeated BV and yeast testing.  - Reviewed vulvovaginal hygiene, sexual health, and cleaning of sex toys. - Reviewed use of boric acid for managing recurrent BV. Recommended 30 day course of nightly boric acid suppository followed by as needed use with intercourse.  Lindalou Hose Bjorn Hallas, CNM  02/10/23 9:56 AM

## 2023-02-11 NOTE — Telephone Encounter (Signed)
Pt calling; was given a 66m refills on metrogel and now has a yeast inf. Was told to use boric acid.  Does she continue the metrogel even thought it gave her a yeast inf or just do the boric acid.  Adv will need to ask provider but in the meantime to just do the boric acid.  Adv someone will call her back.

## 2023-02-12 LAB — CERVICOVAGINAL ANCILLARY ONLY
Bacterial Vaginitis (gardnerella): POSITIVE — AB
Candida Glabrata: NEGATIVE
Candida Vaginitis: NEGATIVE
Chlamydia: NEGATIVE
Comment: NEGATIVE
Comment: NEGATIVE
Comment: NEGATIVE
Comment: NEGATIVE
Comment: NEGATIVE
Comment: NORMAL
Neisseria Gonorrhea: NEGATIVE
Trichomonas: NEGATIVE

## 2023-02-13 MED ORDER — METRONIDAZOLE 500 MG PO TABS
500.0000 mg | ORAL_TABLET | Freq: Two times a day (BID) | ORAL | 0 refills | Status: AC
Start: 2023-02-13 — End: 2023-02-20

## 2023-02-13 NOTE — Addendum Note (Signed)
Addended by: Autumn Messing on: 02/13/2023 08:17 AM   Modules accepted: Orders

## 2023-02-17 LAB — CYTOLOGY - PAP: Adequacy: ABNORMAL

## 2023-02-25 ENCOUNTER — Ambulatory Visit: Payer: Medicaid Other | Admitting: Certified Nurse Midwife

## 2023-02-25 DIAGNOSIS — Z124 Encounter for screening for malignant neoplasm of cervix: Secondary | ICD-10-CM

## 2023-02-25 DIAGNOSIS — Z01419 Encounter for gynecological examination (general) (routine) without abnormal findings: Secondary | ICD-10-CM

## 2023-03-02 ENCOUNTER — Other Ambulatory Visit (HOSPITAL_COMMUNITY)
Admission: RE | Admit: 2023-03-02 | Discharge: 2023-03-02 | Disposition: A | Discharge status: Home / Self Care | Payer: Medicaid Other | Source: Ambulatory Visit

## 2023-03-02 ENCOUNTER — Ambulatory Visit: Payer: Medicaid Other

## 2023-03-02 VITALS — BP 110/76 | HR 71 | Ht 64.0 in | Wt 119.2 lb

## 2023-03-02 DIAGNOSIS — R87615 Unsatisfactory cytologic smear of cervix: Secondary | ICD-10-CM | POA: Diagnosis not present

## 2023-03-02 DIAGNOSIS — N898 Other specified noninflammatory disorders of vagina: Secondary | ICD-10-CM | POA: Diagnosis present

## 2023-03-02 DIAGNOSIS — Z124 Encounter for screening for malignant neoplasm of cervix: Secondary | ICD-10-CM | POA: Insufficient documentation

## 2023-03-02 NOTE — Progress Notes (Signed)
   GYN ENCOUNTER  Encounter for repeat pap smear  Subjective  HPI: Susan Price is a 22 y.o. G1P1001 who presents today for repeat pap smear.   Pap smear done on 11/5 had insufficient sample to analyze.   Finished antibiotics recently for Tx of BV, still having some vaginal itching. Would like to retest. Feels like boric acid has been helping.   Past Medical History:  Diagnosis Date   Depression    Past Surgical History:  Procedure Laterality Date   TONSILLECTOMY AND ADENOIDECTOMY     OB History     Gravida  1   Para  1   Term  1   Preterm      AB      Living  1      SAB      IAB      Ectopic      Multiple  0   Live Births  1          No Known Allergies  Review of Systems  12 point ROS negative except for pertinent positives noted in HPO above.   Objective  BP 110/76   Pulse 71   Ht 5\' 4"  (1.626 m)   Wt 119 lb 3.2 oz (54.1 kg)   LMP 02/16/2023 (Within Weeks)   BMI 20.46 kg/m   Physical examination GENERAL APPEARANCE: alert, well appearing LUNGS: normal work of breathing HEART: normal heart rate     Pelvic exam: normal external genitalia, vulva, vagina, cervix, uterus and adnexa, VULVA: normal appearing vulva with no masses, tenderness or lesions, VAGINA: normal appearing vagina with normal color and discharge, no lesions, CERVIX: normal appearing cervix without discharge or lesions.   Assessment/Plan - Pap smear and vaginal swab collected today. Will follow up as needed.   Lindalou Hose Euna Armon, CNM  03/02/23 2:22 PM

## 2023-03-04 LAB — CERVICOVAGINAL ANCILLARY ONLY
Bacterial Vaginitis (gardnerella): NEGATIVE
Candida Glabrata: NEGATIVE
Candida Vaginitis: NEGATIVE
Comment: NEGATIVE
Comment: NEGATIVE
Comment: NEGATIVE

## 2023-03-11 LAB — CYTOLOGY - PAP

## 2023-03-17 ENCOUNTER — Ambulatory Visit: Payer: Medicaid Other

## 2023-03-20 ENCOUNTER — Ambulatory Visit (INDEPENDENT_AMBULATORY_CARE_PROVIDER_SITE_OTHER): Payer: Medicaid Other

## 2023-03-20 ENCOUNTER — Other Ambulatory Visit (HOSPITAL_COMMUNITY)
Admission: RE | Admit: 2023-03-20 | Discharge: 2023-03-20 | Disposition: A | Discharge status: Home / Self Care | Payer: Medicaid Other | Source: Ambulatory Visit | Attending: Obstetrics and Gynecology | Admitting: Obstetrics and Gynecology

## 2023-03-20 ENCOUNTER — Other Ambulatory Visit: Payer: Self-pay

## 2023-03-20 VITALS — BP 105/70 | HR 69 | Ht 64.0 in | Wt 118.9 lb

## 2023-03-20 DIAGNOSIS — N760 Acute vaginitis: Secondary | ICD-10-CM

## 2023-03-20 DIAGNOSIS — B9689 Other specified bacterial agents as the cause of diseases classified elsewhere: Secondary | ICD-10-CM | POA: Diagnosis present

## 2023-03-20 NOTE — Progress Notes (Unsigned)
    NURSE VISIT NOTE  Subjective:    Patient ID: Susan Price, female    DOB: 2000/09/25, 22 y.o.   MRN: [id number]  HPI  Patient is a 22 y.o. G64P1001 female who presents for foul, grey, malodorous, milky, and thin vaginal discharge for 3 day(s). Denies abnormal vaginal bleeding or significant pelvic pain or fever. denies dysuria, hematuria, urinary frequency, urinary urgency, and flank pain. Patient denies history of known exposure to STD.   Objective:    BP 105/70   Pulse 69   Ht 5\' 4"  (1.626 m)   Wt 118 lb 14.4 oz (53.9 kg)   LMP 02/16/2023 (Within Weeks)   BMI 20.41 kg/m    @THIS  VISIT ONLY@  Assessment:   1. Bacterial vaginitis     bacterial vaginosis  Plan:   GC and chlamydia DNA  probe sent to lab. Treatment: Will treat with Flagyl 500 BID x 7 days after labs come back, abstain from coitus during course of treatment, and Phd samples were given. ROV prn if symptoms persist or worsen.     Santiago Bumpers, CMA [facility]

## 2023-03-20 NOTE — Patient Instructions (Signed)

## 2023-03-23 LAB — CERVICOVAGINAL ANCILLARY ONLY
Bacterial Vaginitis (gardnerella): POSITIVE — AB
Candida Glabrata: NEGATIVE
Candida Vaginitis: NEGATIVE
Chlamydia: NEGATIVE
Comment: NEGATIVE
Comment: NEGATIVE
Comment: NEGATIVE
Comment: NEGATIVE
Comment: NEGATIVE
Comment: NORMAL
Neisseria Gonorrhea: NEGATIVE
Trichomonas: NEGATIVE

## 2023-03-24 ENCOUNTER — Telehealth: Payer: Self-pay

## 2023-03-24 DIAGNOSIS — B9689 Other specified bacterial agents as the cause of diseases classified elsewhere: Secondary | ICD-10-CM

## 2023-03-24 MED ORDER — METRONIDAZOLE 500 MG PO TABS: 500.0000 mg | ORAL_TABLET | Freq: Two times a day (BID) | ORAL | 0 refills | Status: AC

## 2023-03-24 NOTE — Telephone Encounter (Signed)
Pt calling triage to get test results. Positive for BV. Flagyl sent. Left msg for pt Rx sent.

## 2023-03-24 NOTE — Telephone Encounter (Signed)
Pt called back and is aware

## 2023-09-08 ENCOUNTER — Telehealth: Payer: Self-pay

## 2023-09-08 MED ORDER — FLUCONAZOLE 150 MG PO TABS: ORAL_TABLET | ORAL | 0 refills | Status: DC

## 2023-09-08 NOTE — Telephone Encounter (Signed)
 Patient called she has been on antibiotics for 6 months and knows that she has a yeast infection. She would like a prescription called in for Diflucan . I sent prescription to pharmacy on file.

## 2023-10-10 ENCOUNTER — Emergency Department

## 2023-10-10 ENCOUNTER — Emergency Department
Admission: EM | Admit: 2023-10-10 | Discharge: 2023-10-11 | Disposition: A | Discharge status: Home / Self Care | Attending: Emergency Medicine | Admitting: Emergency Medicine

## 2023-10-10 ENCOUNTER — Other Ambulatory Visit: Payer: Self-pay

## 2023-10-10 DIAGNOSIS — W01198A Fall on same level from slipping, tripping and stumbling with subsequent striking against other object, initial encounter: Secondary | ICD-10-CM | POA: Diagnosis not present

## 2023-10-10 DIAGNOSIS — S6991XA Unspecified injury of right wrist, hand and finger(s), initial encounter: Secondary | ICD-10-CM | POA: Diagnosis present

## 2023-10-10 DIAGNOSIS — S62346A Nondisplaced fracture of base of fifth metacarpal bone, right hand, initial encounter for closed fracture: Secondary | ICD-10-CM | POA: Insufficient documentation

## 2023-10-10 DIAGNOSIS — M7989 Other specified soft tissue disorders: Secondary | ICD-10-CM | POA: Diagnosis not present

## 2023-10-10 DIAGNOSIS — Y93K1 Activity, walking an animal: Secondary | ICD-10-CM | POA: Diagnosis not present

## 2023-10-10 DIAGNOSIS — M79641 Pain in right hand: Secondary | ICD-10-CM

## 2023-10-10 NOTE — ED Triage Notes (Signed)
 Pt was going to walk her dog and when she was going down the step the dog jolted off causing her to fall, she braced her fall with her right hand. She is coming in with pain in the right hand with no obvious deformity but does have bruising to the left 4th digit. No other complaints at this time and she is otherwise stable.

## 2023-10-11 ENCOUNTER — Emergency Department

## 2023-10-11 MED ORDER — OXYCODONE HCL 5 MG PO TABS
5.0000 mg | ORAL_TABLET | Freq: Once | ORAL | Status: AC
  Administered 2023-10-11: 5 mg via ORAL
  Filled 2023-10-11: qty 1

## 2023-10-11 MED ORDER — OXYCODONE HCL 5 MG PO CAPS: 5.0000 mg | ORAL_CAPSULE | Freq: Four times a day (QID) | ORAL | 0 refills | Status: AC | PRN

## 2023-10-11 MED ORDER — ACETAMINOPHEN 500 MG PO TABS
1000.0000 mg | ORAL_TABLET | Freq: Once | ORAL | Status: AC
  Administered 2023-10-11: 1000 mg via ORAL
  Filled 2023-10-11: qty 2

## 2023-10-11 NOTE — Discharge Instructions (Signed)
 You can take 650 mg of Tylenol  or 400 mg of ibuprofen  every 6 hours as needed for pain.  Please reserve the oxycodone  for severe breakthrough pain.  Please do not get the splint wet.  Please make sure to follow with orthopedic surgery for further management of your fracture.

## 2023-10-11 NOTE — ED Provider Notes (Signed)
 Susan Price Provider Note    Event Date/Time   First MD Initiated Contact with Patient 10/11/23 0005     (approximate)   History   Hand Injury   HPI  Susan Price is a 23 y.o. 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  Patient states that she was walking her dog, the dog pulled her and she fell and hit the right lateral hand.  States that she is right-handed.  Has some bruising to the right fourth digit.  Able to passively range her hand but has pain with active movement.  She denies any weakness or numbness that is new.  Denies any head strike or LOC, no pain anywhere else.      Physical Exam   Triage Vital Signs: ED Triage Vitals  Encounter Vitals Group     BP 10/10/23 2241 (!) 95/59     Girls Systolic BP Percentile --      Girls Diastolic BP Percentile --      Boys Systolic BP Percentile --      Boys Diastolic BP Percentile --      Pulse Rate 10/10/23 2241 87     Resp 10/10/23 2241 18     Temp 10/10/23 2241 98.9 F (37.2 C)     Temp src --      SpO2 10/10/23 2241 100 %     Weight --      Height --      Head Circumference --      Peak Flow --      Pain Score 10/10/23 2239 8     Pain Loc --      Pain Education --      Exclude from Growth Chart --     Most recent vital signs: Vitals:   10/10/23 2241 10/11/23 0209  BP: (!) 95/59 (!) 112/96  Pulse: 87 71  Resp: 18 17  Temp: 98.9 F (37.2 C) 98.3 F (36.8 C)  SpO2: 100% 99%     General: Awake, no distress.  CV:  Good peripheral perfusion.  Resp:  Normal effort.  Abd:  No distention.  Other:  Radial pulse are equal bilaterally, sensation is intact, able to resist flexion and extension of her fingers, no scaphoid tenderness, she does have tenderness to the right lateral wrist, no swelling, ecchymoses or erythema to the wrist, no open wounds, also has tenderness to the right hand along the fifth metacarpal as well  is base of fifth digit, she does have ecchymoses to the fourth digit with some associated tenderness.  Able to passively range her wrist as well as her fingers.   ED Results / Procedures / Treatments   Labs (all labs ordered are listed, but only abnormal results are displayed) Labs Reviewed - No data to display    RADIOLOGY On my independent interpretation, x-ray shows fifth metacarpal fracture   PROCEDURES:  Critical Care performed: No  Procedures   MEDICATIONS ORDERED IN ED: Medications  acetaminophen  (TYLENOL ) tablet 1,000 mg (1,000 mg Oral Given 10/11/23 0124)  oxyCODONE  (Oxy IR/ROXICODONE ) immediate release tablet 5 mg (5 mg Oral Given 10/11/23 0124)     IMPRESSION / MDM / ASSESSMENT AND PLAN / ED COURSE  I reviewed the triage vital signs and the nursing notes.  Differential diagnosis includes, but is not limited to, fracture, dislocation, strain, sprain, contusion, bruising.  Will get x-rays of the hand and wrist.  Tylenol , oxycodone .  Patient's presentation is most consistent with acute complicated illness / injury requiring diagnostic workup.  Independent interpretation of  imaging below.  Discussed with patient about imaging results, ordered ulnar gutter splint.  Will give him number to call for orthopedic surgery for outpatient follow-up.  Discussed with her about splint management outpatient, discussed using Tylenol  ibuprofen  as needed for pain and to reserve oxycodone  for severe breakthrough pain.  Considered but no indication for inpatient admission at this time, she safe for outpatient management.  Discharge with strict precautions.    Clinical Course as of 10/11/23 0232  [place] Oct 11, 2023  0008 DG Hand Complete Right IMPRESSION: Acute comminuted fractures of the base of the right fifth metacarpal bone with intra-articular involvement. Associated soft tissue swelling.   [TT]  0133 DG Wrist Complete  Right IMPRESSION: Redemonstration of acute intra-articular comminuted fifth digit metacarpal base fracture.   [TT]    Clinical Course User Index [TT] Susan Lorelle Cummins, MD     FINAL CLINICAL IMPRESSION(S) / ED DIAGNOSES   Final diagnoses:  Right hand pain  Closed nondisplaced fracture of base of fifth metacarpal bone of right hand, initial encounter     Rx / DC Orders   ED Discharge Orders          Ordered    oxycodone  (OXY-IR) 5 MG capsule  Every 6 hours PRN        10/11/23 0135             Note:  This document was prepared using Dragon voice recognition software and may include unintentional dictation errors.    Susan Lorelle Cummins, MD 10/11/23 [phone]

## 2024-04-20 ENCOUNTER — Ambulatory Visit: Admitting: Cardiology

## 2024-04-20 ENCOUNTER — Encounter: Payer: Self-pay | Admitting: Cardiology

## 2024-04-20 ENCOUNTER — Ambulatory Visit: Payer: Self-pay | Admitting: Cardiology

## 2024-04-20 VITALS — BP 106/78 | HR 79 | Ht 64.0 in | Wt 127.2 lb

## 2024-04-20 DIAGNOSIS — R300 Dysuria: Secondary | ICD-10-CM | POA: Insufficient documentation

## 2024-04-20 DIAGNOSIS — G43909 Migraine, unspecified, not intractable, without status migrainosus: Secondary | ICD-10-CM | POA: Diagnosis not present

## 2024-04-20 DIAGNOSIS — Z0130 Encounter for examination of blood pressure without abnormal findings: Secondary | ICD-10-CM

## 2024-04-20 DIAGNOSIS — N3000 Acute cystitis without hematuria: Secondary | ICD-10-CM | POA: Diagnosis not present

## 2024-04-20 DIAGNOSIS — R233 Spontaneous ecchymoses: Secondary | ICD-10-CM | POA: Diagnosis not present

## 2024-04-20 LAB — POCT URINALYSIS DIPSTICK
Bilirubin, UA: NEGATIVE
Blood, UA: NEGATIVE
Glucose, UA: NEGATIVE
Ketones, UA: NEGATIVE
Nitrite, UA: NEGATIVE
Protein, UA: NEGATIVE
Spec Grav, UA: 1.015
Urobilinogen, UA: 0.2 U/dL
pH, UA: 6

## 2024-04-20 MED ORDER — NITROFURANTOIN MONOHYD MACRO 100 MG PO CAPS: 100.0000 mg | ORAL_CAPSULE | Freq: Two times a day (BID) | ORAL | 0 refills | Status: AC

## 2024-04-20 MED ORDER — SUMATRIPTAN SUCCINATE 25 MG PO TABS: 25.0000 mg | ORAL_TABLET | ORAL | 0 refills | Status: AC | PRN

## 2024-04-20 MED ORDER — FLUCONAZOLE 150 MG PO TABS: ORAL_TABLET | ORAL | 0 refills | Status: AC

## 2024-04-20 NOTE — Progress Notes (Signed)
 "  Established Patient Office Visit  Subjective:  Patient ID: Susan Price, female    DOB: 03-01-2001  Age: 24 y.o. MRN: [id number]  Chief Complaint  Patient presents with   Acute Visit    Possible UTI    Patient in office for an acute visit, thinks she may have a UTI. Complaining of having to urinate frequently and urgently for the past week, dysuria. UA abnormal, will send for culture. Will send in Macrobid .   Complains of a bump on her left forehead, does not heard. Will continue to monitor it.  Patient complaining of frequent headaches, states mom and grandmother have migraines, will send in Imitrex .  Patient complaining of easy bruising, will check a CBC and iron levels.   Urinary Tract Infection  This is a new problem. The current episode started in the past 7 days. The problem occurs every urination. The problem has been waxing and waning. The quality of the pain is described as burning. There has been no fever. Associated symptoms include frequency and urgency. Pertinent negatives include no flank pain or hematuria. The treatment provided no relief.    No other concerns at this time.   Past Medical History:  Diagnosis Date   Depression     Past Surgical History:  Procedure Laterality Date   TONSILLECTOMY AND ADENOIDECTOMY      Social History   Socioeconomic History   Marital status: Single    Spouse name: Not on file   Number of children: Not on file   Years of education: Not on file   Highest education level: Not on file  Occupational History   Not on file  Tobacco Use   Smoking status: Former    Types: Cigarettes   Smokeless tobacco: Never  Vaping Use   Vaping status: Former  Substance and Sexual Activity   Alcohol use: No   Drug use: Not Currently    Types: Marijuana    Comment: a couple times during the beggingin of pregnancy]   Sexual activity: Yes    Partners: Male    Birth control/protection: None  Other Topics Concern   Not on file  Social  History Narrative   Not on file   Social Drivers of Health   Tobacco Use: Medium Risk (04/20/2024)   Patient History    Smoking Tobacco Use: Former    Smokeless Tobacco Use: Never    Passive Exposure: Not on Actuary Strain: Not on file  Food Insecurity: Not on file  Transportation Needs: Not on file  Physical Activity: Not on file  Stress: Not on file  Social Connections: Not on file  Intimate Partner Violence: Not on file  Depression (EYV7-0): Not on file  Alcohol Screen: Not on file  Housing: Not on file  Utilities: Not on file  Health Literacy: Not on file    Family History  Problem Relation Age of Onset   Anxiety disorder Mother     Allergies[1]  Show/hide medication list[2]  Review of Systems  Constitutional: Negative.   HENT: Negative.    Eyes: Negative.   Respiratory: Negative.  Negative for shortness of breath.   Cardiovascular: Negative.  Negative for chest pain.  Gastrointestinal: Negative.  Negative for abdominal pain, constipation and diarrhea.  Genitourinary:  Positive for frequency and urgency. Negative for flank pain and hematuria.  Musculoskeletal:  Negative for joint pain and myalgias.  Skin: Negative.   Neurological:  Positive for headaches. Negative for dizziness.  Endo/Heme/Allergies:  Bruises/bleeds  easily.  All other systems reviewed and are negative.      Objective:   BP 106/78   Pulse 79   Ht 5' 4 (1.626 m)   Wt 127 lb 3.2 oz (57.7 kg)   SpO2 98%   BMI 21.83 kg/m   Vitals:   04/20/24 1133  BP: 106/78  Pulse: 79  Height: 5' 4 (1.626 m)  Weight: 127 lb 3.2 oz (57.7 kg)  SpO2: 98%  BMI (Calculated): 21.82    Physical Exam Vitals and nursing note reviewed.  Constitutional:      Appearance: Normal appearance. She is normal weight.  HENT:     Head: Normocephalic and atraumatic.      Nose: Nose normal.     Mouth/Throat:     Mouth: Mucous membranes are moist.  Eyes:     Extraocular Movements:  Extraocular movements intact.     Conjunctiva/sclera: Conjunctivae normal.     Pupils: Pupils are equal, round, and reactive to light.  Cardiovascular:     Rate and Rhythm: Normal rate and regular rhythm.     Pulses: Normal pulses.     Heart sounds: Normal heart sounds.  Pulmonary:     Effort: Pulmonary effort is normal.     Breath sounds: Normal breath sounds.  Abdominal:     General: Abdomen is flat. Bowel sounds are normal.     Palpations: Abdomen is soft.  Musculoskeletal:        General: Normal range of motion.     Cervical back: Normal range of motion.  Skin:    General: Skin is warm and dry.  Neurological:     General: No focal deficit present.     Mental Status: She is alert and oriented to person, place, and time.  Psychiatric:        Mood and Affect: Mood normal.        Behavior: Behavior normal.        Thought Content: Thought content normal.        Judgment: Judgment normal.      Results for orders placed or performed in visit on 04/20/24  CBC with Differential/Platelet  Result Value Ref Range   WBC 4.0 3.4 - 10.8 x10E3/uL   RBC 4.81 3.77 - 5.28 x10E6/uL   Hemoglobin 14.4 11.1 - 15.9 g/dL   Hematocrit 56.7 65.9 - 46.6 %   MCV 90 79 - 97 fL   MCH 29.9 26.6 - 33.0 pg   MCHC 33.3 31.5 - 35.7 g/dL   RDW 86.1 88.2 - 84.5 %   Platelets 223 150 - 450 x10E3/uL   Neutrophils 48 Not Estab. %   Lymphs 37 Not Estab. %   Monocytes 12 Not Estab. %   Eos 2 Not Estab. %   Basos 1 Not Estab. %   Neutrophils Absolute 1.9 1.4 - 7.0 x10E3/uL   Lymphocytes Absolute 1.5 0.7 - 3.1 x10E3/uL   Monocytes Absolute 0.5 0.1 - 0.9 x10E3/uL   EOS (ABSOLUTE) 0.1 0.0 - 0.4 x10E3/uL   Basophils Absolute 0.0 0.0 - 0.2 x10E3/uL   Immature Granulocytes 0 Not Estab. %   Immature Grans (Abs) 0.0 0.0 - 0.1 x10E3/uL  Iron, TIBC and Ferritin Panel  Result Value Ref Range   Total Iron Binding Capacity 366 250 - 450 ug/dL   UIBC 667 868 - 574 ug/dL   Iron 34 27 - 840 ug/dL   Iron  Saturation 9 (LL) 15 - 55 %   Ferritin 36 15 - 150  ng/mL  POCT Urinalysis Dipstick (81002)  Result Value Ref Range   Color, UA Yellow    Clarity, UA Cloudy    Glucose, UA Negative Negative   Bilirubin, UA Negative    Ketones, UA Negative    Spec Grav, UA 1.015 1.010 - 1.025   Blood, UA Negative    pH, UA 6.0 5.0 - 8.0   Protein, UA Negative Negative   Urobilinogen, UA 0.2 0.2 or 1.0 E.U./dL   Nitrite, UA Negative    Leukocytes, UA Large (3+) (A) Negative   Appearance Cloudy    Odor Yes     Recent Results (from the past 2160 hours)  POCT Urinalysis Dipstick (18997)     Status: Abnormal   Collection Time: 04/20/24 11:45 AM  Result Value Ref Range   Color, UA Yellow    Clarity, UA Cloudy    Glucose, UA Negative Negative   Bilirubin, UA Negative    Ketones, UA Negative    Spec Grav, UA 1.015 1.010 - 1.025   Blood, UA Negative    pH, UA 6.0 5.0 - 8.0   Protein, UA Negative Negative   Urobilinogen, UA 0.2 0.2 or 1.0 E.U./dL   Nitrite, UA Negative    Leukocytes, UA Large (3+) (A) Negative   Appearance Cloudy    Odor Yes   CBC with Differential/Platelet     Status: None   Collection Time: 04/20/24 12:22 PM  Result Value Ref Range   WBC 4.0 3.4 - 10.8 x10E3/uL   RBC 4.81 3.77 - 5.28 x10E6/uL   Hemoglobin 14.4 11.1 - 15.9 g/dL   Hematocrit 56.7 65.9 - 46.6 %   MCV 90 79 - 97 fL   MCH 29.9 26.6 - 33.0 pg   MCHC 33.3 31.5 - 35.7 g/dL   RDW 86.1 88.2 - 84.5 %   Platelets 223 150 - 450 x10E3/uL   Neutrophils 48 Not Estab. %   Lymphs 37 Not Estab. %   Monocytes 12 Not Estab. %   Eos 2 Not Estab. %   Basos 1 Not Estab. %   Neutrophils Absolute 1.9 1.4 - 7.0 x10E3/uL   Lymphocytes Absolute 1.5 0.7 - 3.1 x10E3/uL   Monocytes Absolute 0.5 0.1 - 0.9 x10E3/uL   EOS (ABSOLUTE) 0.1 0.0 - 0.4 x10E3/uL   Basophils Absolute 0.0 0.0 - 0.2 x10E3/uL   Immature Granulocytes 0 Not Estab. %   Immature Grans (Abs) 0.0 0.0 - 0.1 x10E3/uL  Iron, TIBC and Ferritin Panel     Status: Abnormal    Collection Time: 04/20/24 12:22 PM  Result Value Ref Range   Total Iron Binding Capacity 366 250 - 450 ug/dL   UIBC 667 868 - 574 ug/dL   Iron 34 27 - 840 ug/dL   Iron Saturation 9 (LL) 15 - 55 %   Ferritin 36 15 - 150 ng/mL      Assessment & Plan:  Urine culture Macrobid  Imitrex  Blood work  Problem List Items Addressed This Visit       Cardiovascular and Mediastinum   Migraine without status migrainosus, not intractable   Relevant Medications   SUMAtriptan  (IMITREX ) 25 MG tablet     Genitourinary   Acute cystitis without hematuria - Primary     Other   Easy bruising   Relevant Orders   CBC with Differential/Platelet (Completed)   Iron, TIBC and Ferritin Panel (Completed)   Dysuria   Relevant Orders   POCT Urinalysis Dipstick (18997) (Completed)   Urine Culture  Return in about 4 weeks (around 05/18/2024).   Total time spent: 25 minutes. This time includes review of previous notes and results and patient face to face interaction during today's visit.    Jeoffrey Pollen, NP  04/20/2024   This document may have been prepared by [facility] Voice Recognition software and as such may include unintentional dictation errors.      [1] No Known Allergies [2]  Outpatient Medications Prior to Visit  Medication Sig   metroNIDAZOLE  (FLAGYL ) 500 MG tablet Take 1 tablet (500 mg total) by mouth 2 (two) times daily. (Patient not taking: Reported on 04/20/2024)   oxycodone  (OXY-IR) 5 MG capsule Take 1 capsule (5 mg total) by mouth every 6 (six) hours as needed for up to 8 doses. (Patient not taking: Reported on 04/20/2024)   [DISCONTINUED] fluconazole  (DIFLUCAN ) 150 MG tablet Take one tablet by mouth once and repeat if needed. (Patient not taking: Reported on 04/20/2024)   No facility-administered medications prior to visit.   "

## 2024-04-21 LAB — CBC WITH DIFFERENTIAL/PLATELET
Basophils Absolute: 0 x10E3/uL (ref 0.0–0.2)
Basos: 1 %
EOS (ABSOLUTE): 0.1 x10E3/uL (ref 0.0–0.4)
Eos: 2 %
Hematocrit: 43.2 % (ref 34.0–46.6)
Hemoglobin: 14.4 g/dL (ref 11.1–15.9)
Immature Grans (Abs): 0 x10E3/uL (ref 0.0–0.1)
Immature Granulocytes: 0 %
Lymphocytes Absolute: 1.5 x10E3/uL (ref 0.7–3.1)
Lymphs: 37 %
MCH: 29.9 pg (ref 26.6–33.0)
MCHC: 33.3 g/dL (ref 31.5–35.7)
MCV: 90 fL (ref 79–97)
Monocytes Absolute: 0.5 x10E3/uL (ref 0.1–0.9)
Monocytes: 12 %
Neutrophils Absolute: 1.9 x10E3/uL (ref 1.4–7.0)
Neutrophils: 48 %
Platelets: 223 x10E3/uL (ref 150–450)
RBC: 4.81 x10E6/uL (ref 3.77–5.28)
RDW: 13.8 % (ref 11.7–15.4)
WBC: 4 x10E3/uL (ref 3.4–10.8)

## 2024-04-21 LAB — IRON,TIBC AND FERRITIN PANEL
Ferritin: 36 ng/mL (ref 15–150)
Iron Saturation: 9 % — CL (ref 15–55)
Iron: 34 ug/dL (ref 27–159)
Total Iron Binding Capacity: 366 ug/dL (ref 250–450)
UIBC: 332 ug/dL (ref 131–425)

## 2024-04-22 LAB — URINE CULTURE

## 2024-05-19 ENCOUNTER — Ambulatory Visit: Admitting: Cardiology
